# Patient Record
Sex: Female | Born: 1950
Health system: Southern US, Community
[De-identification: ages and names within clinical notes are randomized; demographics above are authoritative.]

## PROBLEM LIST (undated history)

## (undated) DIAGNOSIS — Z87440 Personal history of urinary (tract) infections: Secondary | ICD-10-CM

## (undated) DIAGNOSIS — E785 Hyperlipidemia, unspecified: Secondary | ICD-10-CM

## (undated) DIAGNOSIS — R200 Anesthesia of skin: Secondary | ICD-10-CM

## (undated) DIAGNOSIS — E669 Obesity, unspecified: Secondary | ICD-10-CM

## (undated) DIAGNOSIS — M7730 Calcaneal spur, unspecified foot: Secondary | ICD-10-CM

## (undated) DIAGNOSIS — I10 Essential (primary) hypertension: Secondary | ICD-10-CM

## (undated) DIAGNOSIS — Z87448 Personal history of other diseases of urinary system: Secondary | ICD-10-CM

## (undated) DIAGNOSIS — R51 Headache: Secondary | ICD-10-CM

## (undated) DIAGNOSIS — K5790 Diverticulosis of intestine, part unspecified, without perforation or abscess without bleeding: Secondary | ICD-10-CM

## (undated) DIAGNOSIS — M8580 Other specified disorders of bone density and structure, unspecified site: Secondary | ICD-10-CM

## (undated) HISTORY — DX: Other specified disorders of bone density and structure, unspecified site: M85.80

## (undated) HISTORY — PX: EXPLORATORY LAPAROTOMY: SUR591

## (undated) HISTORY — DX: Personal history of other diseases of urinary system: Z87.448

## (undated) HISTORY — DX: Essential (primary) hypertension: I10

## (undated) HISTORY — DX: Obesity, unspecified: E66.9

## (undated) HISTORY — PX: OTHER SURGICAL HISTORY: SHX169

## (undated) HISTORY — DX: Diverticulosis of intestine, part unspecified, without perforation or abscess without bleeding: K57.90

## (undated) HISTORY — DX: Hyperlipidemia, unspecified: E78.5

## (undated) HISTORY — DX: Anesthesia of skin: R20.0

## (undated) HISTORY — DX: Headache: R51

## (undated) HISTORY — DX: Calcaneal spur, unspecified foot: M77.30

## (undated) HISTORY — DX: Personal history of urinary (tract) infections: Z87.440

---

## 1999-08-19 ENCOUNTER — Other Ambulatory Visit: Admission: RE | Admit: 1999-08-19 | Discharge: 1999-08-19 | Payer: Self-pay | Admitting: Internal Medicine

## 2000-06-04 ENCOUNTER — Encounter: Admission: RE | Admit: 2000-06-04 | Discharge: 2000-06-04 | Payer: Self-pay | Admitting: Obstetrics and Gynecology

## 2000-06-04 ENCOUNTER — Encounter: Payer: Self-pay | Admitting: Obstetrics and Gynecology

## 2000-08-23 ENCOUNTER — Other Ambulatory Visit: Admission: RE | Admit: 2000-08-23 | Discharge: 2000-08-23 | Payer: Self-pay | Admitting: Internal Medicine

## 2001-06-06 ENCOUNTER — Encounter: Admission: RE | Admit: 2001-06-06 | Discharge: 2001-06-06 | Payer: Self-pay | Admitting: Internal Medicine

## 2001-06-06 ENCOUNTER — Encounter: Payer: Self-pay | Admitting: Internal Medicine

## 2001-08-29 ENCOUNTER — Other Ambulatory Visit: Admission: RE | Admit: 2001-08-29 | Discharge: 2001-08-29 | Payer: Self-pay | Admitting: Internal Medicine

## 2002-06-08 ENCOUNTER — Encounter: Payer: Self-pay | Admitting: Internal Medicine

## 2002-06-08 ENCOUNTER — Encounter: Admission: RE | Admit: 2002-06-08 | Discharge: 2002-06-08 | Payer: Self-pay | Admitting: Internal Medicine

## 2002-09-12 ENCOUNTER — Other Ambulatory Visit: Admission: RE | Admit: 2002-09-12 | Discharge: 2002-09-12 | Payer: Self-pay | Admitting: Internal Medicine

## 2003-06-11 ENCOUNTER — Encounter: Admission: RE | Admit: 2003-06-11 | Discharge: 2003-06-11 | Payer: Self-pay | Admitting: Internal Medicine

## 2003-06-11 ENCOUNTER — Encounter: Payer: Self-pay | Admitting: Internal Medicine

## 2003-10-25 ENCOUNTER — Other Ambulatory Visit: Admission: RE | Admit: 2003-10-25 | Discharge: 2003-10-25 | Payer: Self-pay | Admitting: Internal Medicine

## 2003-12-25 DIAGNOSIS — K5790 Diverticulosis of intestine, part unspecified, without perforation or abscess without bleeding: Secondary | ICD-10-CM

## 2003-12-25 HISTORY — PX: ESOPHAGOGASTRODUODENOSCOPY: SHX1529

## 2003-12-25 HISTORY — DX: Diverticulosis of intestine, part unspecified, without perforation or abscess without bleeding: K57.90

## 2003-12-25 HISTORY — PX: COLONOSCOPY: SHX174

## 2004-01-07 ENCOUNTER — Encounter (INDEPENDENT_AMBULATORY_CARE_PROVIDER_SITE_OTHER): Payer: Self-pay | Admitting: Specialist

## 2004-01-07 ENCOUNTER — Ambulatory Visit (HOSPITAL_COMMUNITY): Admission: RE | Admit: 2004-01-07 | Discharge: 2004-01-07 | Payer: Self-pay | Admitting: *Deleted

## 2004-03-23 ENCOUNTER — Emergency Department (HOSPITAL_COMMUNITY): Admission: EM | Admit: 2004-03-23 | Discharge: 2004-03-23 | Payer: Self-pay

## 2004-06-11 ENCOUNTER — Encounter: Admission: RE | Admit: 2004-06-11 | Discharge: 2004-06-11 | Payer: Self-pay | Admitting: Internal Medicine

## 2004-11-18 ENCOUNTER — Other Ambulatory Visit: Admission: RE | Admit: 2004-11-18 | Discharge: 2004-11-18 | Payer: Self-pay | Admitting: Internal Medicine

## 2005-06-19 ENCOUNTER — Encounter: Admission: RE | Admit: 2005-06-19 | Discharge: 2005-06-19 | Payer: Self-pay | Admitting: Internal Medicine

## 2005-07-14 ENCOUNTER — Encounter: Admission: RE | Admit: 2005-07-14 | Discharge: 2005-07-14 | Payer: Self-pay | Admitting: Internal Medicine

## 2005-08-21 ENCOUNTER — Encounter (INDEPENDENT_AMBULATORY_CARE_PROVIDER_SITE_OTHER): Payer: Self-pay | Admitting: *Deleted

## 2005-08-21 ENCOUNTER — Ambulatory Visit (HOSPITAL_COMMUNITY): Admission: RE | Admit: 2005-08-21 | Discharge: 2005-08-21 | Payer: Self-pay | Admitting: *Deleted

## 2006-05-18 ENCOUNTER — Other Ambulatory Visit: Admission: RE | Admit: 2006-05-18 | Discharge: 2006-05-18 | Payer: Self-pay | Admitting: Internal Medicine

## 2006-07-15 ENCOUNTER — Encounter: Admission: RE | Admit: 2006-07-15 | Discharge: 2006-07-15 | Payer: Self-pay | Admitting: Internal Medicine

## 2007-05-23 ENCOUNTER — Other Ambulatory Visit: Admission: RE | Admit: 2007-05-23 | Discharge: 2007-05-23 | Payer: Self-pay | Admitting: Cardiology

## 2007-07-19 ENCOUNTER — Encounter: Admission: RE | Admit: 2007-07-19 | Discharge: 2007-07-19 | Payer: Self-pay | Admitting: Internal Medicine

## 2007-08-14 ENCOUNTER — Emergency Department (HOSPITAL_COMMUNITY): Admission: EM | Admit: 2007-08-14 | Discharge: 2007-08-14 | Payer: Self-pay | Admitting: Family Medicine

## 2008-07-10 ENCOUNTER — Encounter: Admission: RE | Admit: 2008-07-10 | Discharge: 2008-07-10 | Payer: Self-pay | Admitting: Internal Medicine

## 2008-07-20 ENCOUNTER — Encounter: Admission: RE | Admit: 2008-07-20 | Discharge: 2008-07-20 | Payer: Self-pay | Admitting: Internal Medicine

## 2008-08-02 ENCOUNTER — Other Ambulatory Visit: Admission: RE | Admit: 2008-08-02 | Discharge: 2008-08-02 | Payer: Self-pay | Admitting: Interventional Radiology

## 2008-08-02 ENCOUNTER — Encounter: Admission: RE | Admit: 2008-08-02 | Discharge: 2008-08-02 | Payer: Self-pay | Admitting: Internal Medicine

## 2008-08-02 ENCOUNTER — Encounter (INDEPENDENT_AMBULATORY_CARE_PROVIDER_SITE_OTHER): Payer: Self-pay | Admitting: Interventional Radiology

## 2009-02-04 ENCOUNTER — Ambulatory Visit: Payer: Self-pay | Admitting: Vascular Surgery

## 2009-05-07 ENCOUNTER — Ambulatory Visit: Payer: Self-pay | Admitting: Vascular Surgery

## 2009-06-10 ENCOUNTER — Ambulatory Visit: Payer: Self-pay | Admitting: Vascular Surgery

## 2009-06-17 ENCOUNTER — Ambulatory Visit: Payer: Self-pay | Admitting: Vascular Surgery

## 2009-06-23 HISTORY — PX: VARICOSE VEIN SURGERY: SHX832

## 2009-07-01 ENCOUNTER — Ambulatory Visit: Payer: Self-pay | Admitting: Vascular Surgery

## 2009-07-09 ENCOUNTER — Ambulatory Visit: Payer: Self-pay | Admitting: Vascular Surgery

## 2009-07-22 ENCOUNTER — Encounter: Admission: RE | Admit: 2009-07-22 | Discharge: 2009-07-22 | Payer: Self-pay | Admitting: Internal Medicine

## 2009-08-15 ENCOUNTER — Ambulatory Visit: Payer: Self-pay | Admitting: Vascular Surgery

## 2010-03-08 IMAGING — US US SOFT TISSUE HEAD/NECK
1 series · 14 of 25 positions shown · non-contrast
Comparison: None

CLINICAL DATA: History of thyroid goiter

THYROID ULTRASOUND
TECHNIQUE: Ultrasound examination of the thyroid gland and
adjacent soft tissues was performed.

[Series 1: us soft tissue head/neck · 0.08mm/px · 14 of 56 slices shown]
[im 1/56]
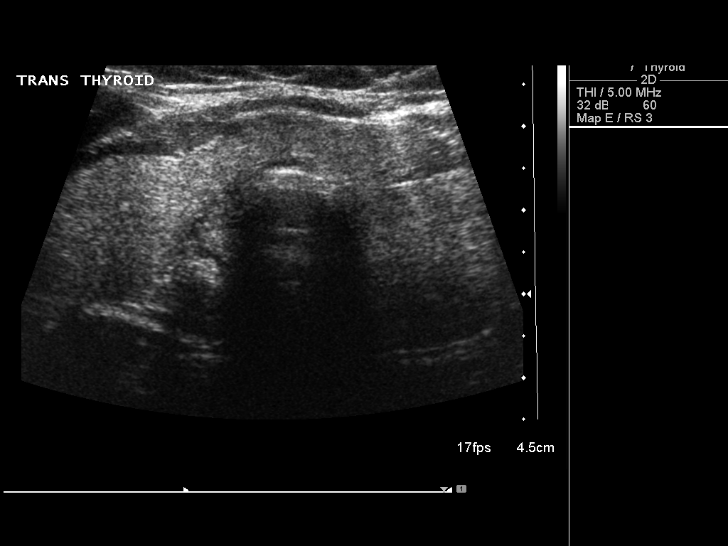
[im 5/56]
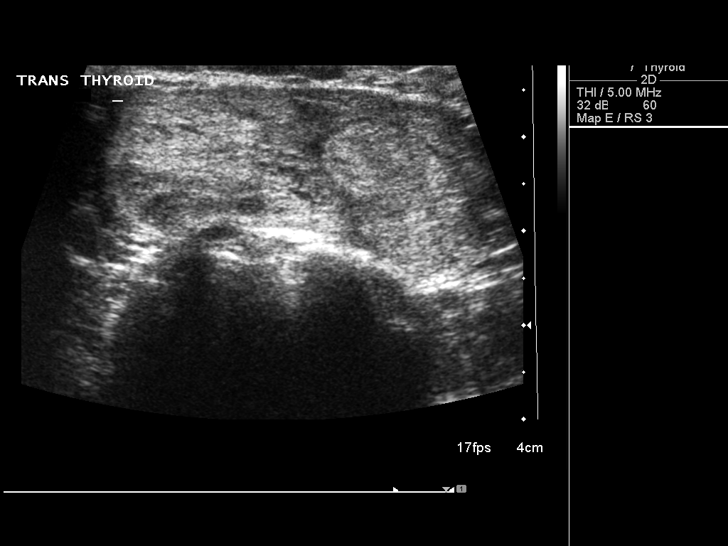
[im 10/56]
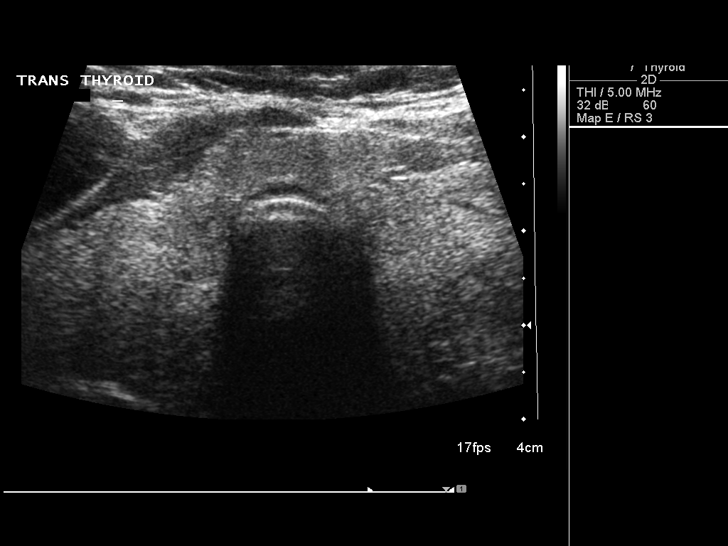
[im 14/56]
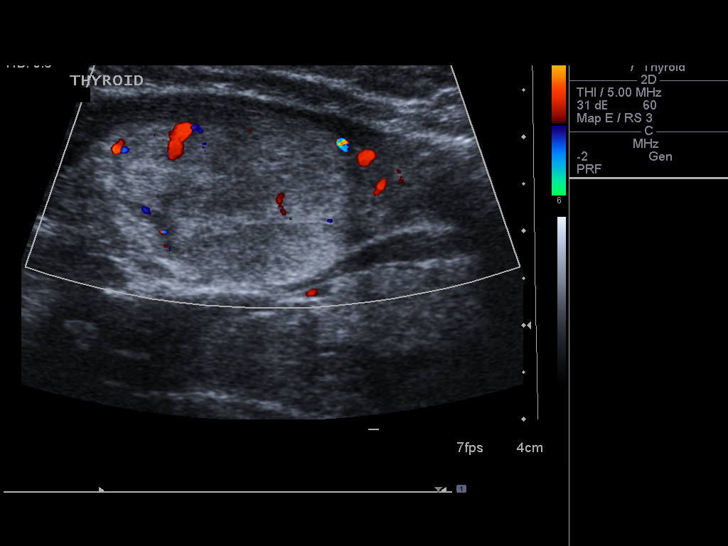
[im 19/56]
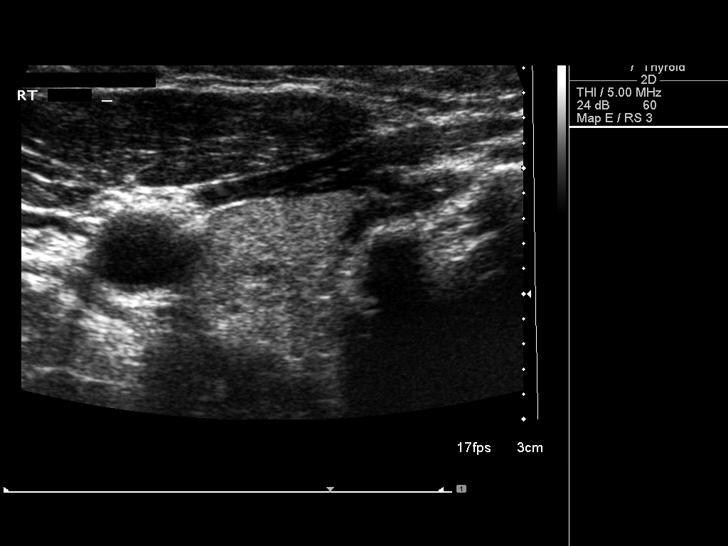
[im 21/56]
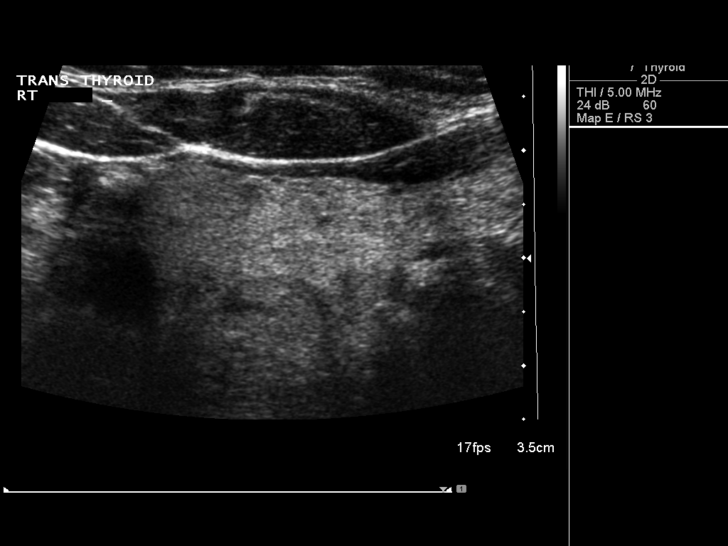
[im 26/56]
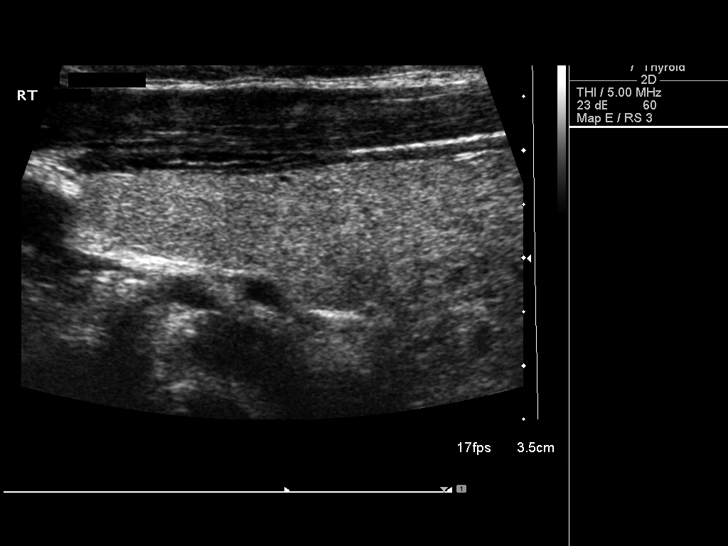
[im 30/56]
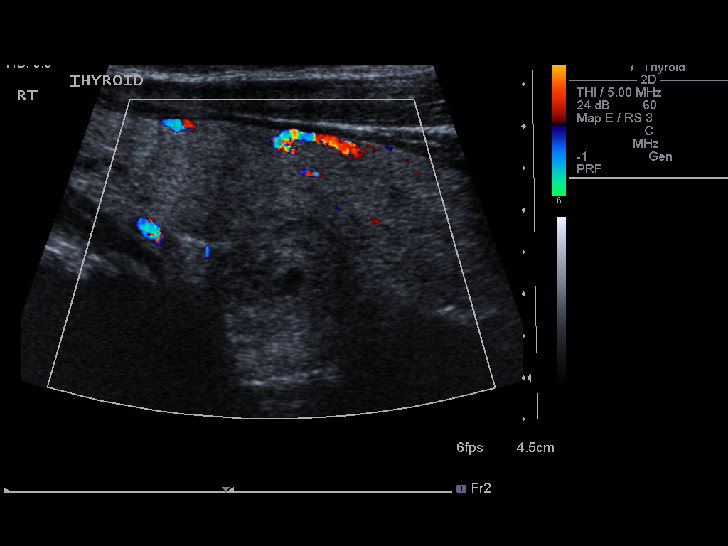
[im 35/56]
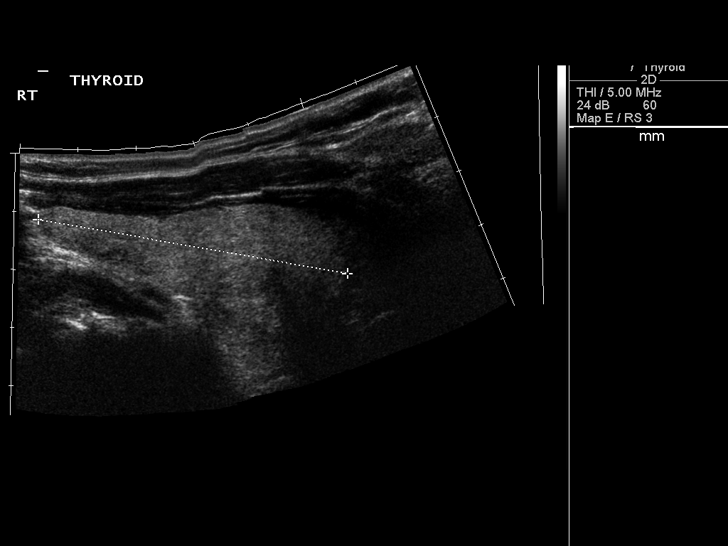
[im 37/56]
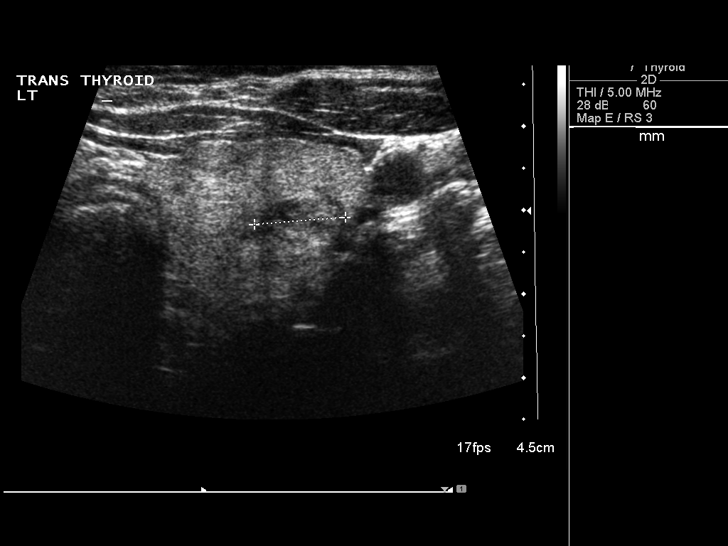
[im 42/56]
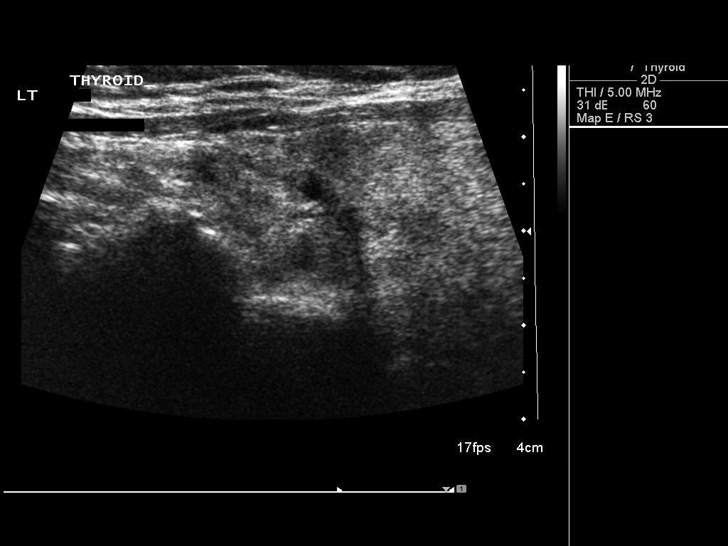
[im 46/56]
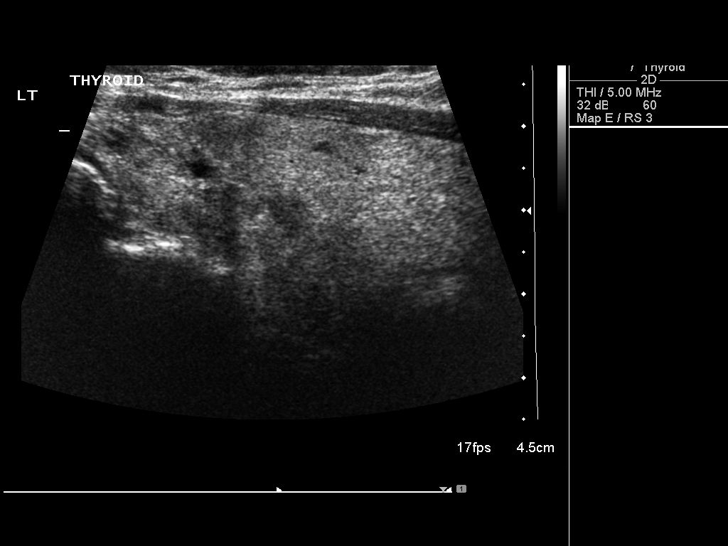
[im 51/56]
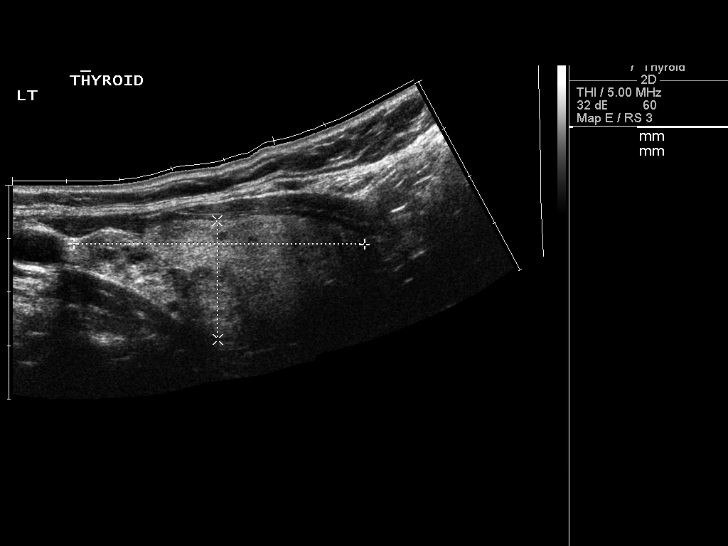
[im 56/56]
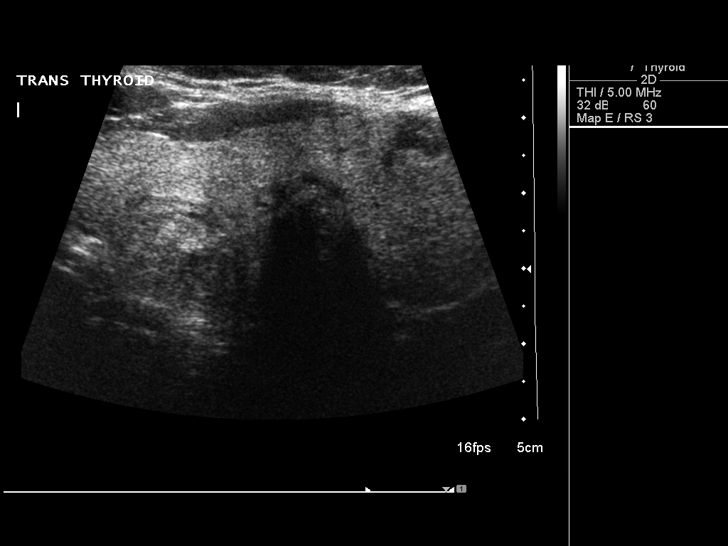

[14 of 25 positions shown; findings below may reference images not displayed]

FINDINGS: The thyroid gland is enlarged and nodular.  The right
lobe measures 5.4 cm sagittally with a depth of 3.0 cm in width of
2.5 cm.  Left lobe measures 5.4 x 2.2 x 2.6 cm, with the isthmus
measuring 1.6 cm in thickness.  There are nodules bilaterally.  The
largest nodule is solid emanating from the superior aspect of the
isthmus to the left of midline and involving the left upper lobe
medially measuring 5.3 x 1.9 x 4.4 cm.  Another nodule is noted in
the lower pole of the right lobe which is solid of 1.9 x 2.1 x
cm.  A small nodule in the mid left lobe laterally measures 1.3 x
0.9 x 1.1 cm.
IMPRESSION: Enlarged and nodular thyroid with at least three measurable nodules
.  Recommend biopsy of the dominant nodules.

## 2010-07-23 ENCOUNTER — Encounter: Admission: RE | Admit: 2010-07-23 | Discharge: 2010-07-23 | Payer: Self-pay | Admitting: Internal Medicine

## 2011-01-20 ENCOUNTER — Other Ambulatory Visit (HOSPITAL_COMMUNITY): Payer: Self-pay | Admitting: Gastroenterology

## 2011-01-20 DIAGNOSIS — K219 Gastro-esophageal reflux disease without esophagitis: Secondary | ICD-10-CM

## 2011-01-30 ENCOUNTER — Encounter (HOSPITAL_COMMUNITY)
Admission: RE | Admit: 2011-01-30 | Discharge: 2011-01-30 | Disposition: A | Discharge status: Home / Self Care | Payer: BC Managed Care – PPO | Source: Ambulatory Visit | Attending: Gastroenterology | Admitting: Gastroenterology

## 2011-01-30 ENCOUNTER — Encounter (HOSPITAL_COMMUNITY): Payer: Self-pay

## 2011-01-30 DIAGNOSIS — R6881 Early satiety: Secondary | ICD-10-CM | POA: Insufficient documentation

## 2011-01-30 DIAGNOSIS — R110 Nausea: Secondary | ICD-10-CM | POA: Insufficient documentation

## 2011-01-30 DIAGNOSIS — E119 Type 2 diabetes mellitus without complications: Secondary | ICD-10-CM | POA: Insufficient documentation

## 2011-01-30 DIAGNOSIS — K3184 Gastroparesis: Secondary | ICD-10-CM | POA: Insufficient documentation

## 2011-01-30 DIAGNOSIS — K219 Gastro-esophageal reflux disease without esophagitis: Secondary | ICD-10-CM | POA: Insufficient documentation

## 2011-01-30 MED ORDER — TECHNETIUM TC 99M SULFUR COLLOID
2.2000 | Freq: Once | INTRAVENOUS | Status: AC | PRN
  Administered 2011-01-30: 2.2 via INTRAVENOUS

## 2011-04-07 NOTE — Procedures (Signed)
LOWER EXTREMITY VENOUS REFLUX EXAM   INDICATION:  Bilateral lower extremity varicose veins.   EXAM:  Using color-flow imaging and pulse Doppler spectral analysis, the  right and left common femoral, superficial femoral, popliteal, posterior  tibial, greater and lesser saphenous veins are evaluated.  There is no  evidence suggesting deep venous insufficiency in the right and left  lower extremity.   The right and left saphenofemoral junctions are not competent.  The  right and left GSV's are not competent with the caliber as described  below.   The left proximal short saphenous vein demonstrates competency.   GSV Diameter (used if found to be incompetent only)                                            Right    Left  Proximal Greater Saphenous Vein           1.9 cm   1.7 cm  Proximal-to-mid-thigh                     1.0 cm   1.0 cm  Mid thigh                                 1.0 cm   0.9 cm  Mid-distal thigh                          1.0 cm   cm  Distal thigh                              1.0 cm   cm  Knee                                      0.8 cm   cm   IMPRESSION:  1. Right and left greater saphenous vein reflux is identified with the      caliber ranging from 1.9 cm to 0.8 cm knee to groin on the right      and from 1.7 cm to 0.9 cm in the saphenofemoral junction to the mid      thigh on the left, at which point the left greater saphenous vein      subdivides into 2 major varicose vein branches which are extremely      tortuous.  2. The right and left greater saphenous veins are not aneurysmal.  3. The left greater saphenous vein is tortuous distal to the mid      thigh; however, proximal to this point, it is a straight  vessel.  4. The deep venous system is competent.  5. The left lesser saphenous vein is not competent and originates from      Giacamini vein.      ___________________________________________  Quita Skye. Hart [place], M.D.   MC/MEDQ  D:   02/04/2009  T:  02/04/2009  Job:  [id number]

## 2011-04-07 NOTE — Assessment & Plan Note (Signed)
OFFICE VISIT   Svec, Latrisha L  DOB:  01-22-1951                                       06/10/2009  [id number]#:[id number]   This is an addendum to the operative procedure note in the office on  July 19th.  Patient underwent laser ablation of her right great  saphenous vein with greater than 20 stab phlebectomies.  The laser  ablation portion required 2 separate laser procedures to completely  close the right great saphenous vein.  Initial entrance into the vein  was just below the knee level, where the vein was large with gross  reflux.  The wire would only burst to the junction of the mid-to-distal  thigh, but because of tortuosity would go no more proximally.  The  second puncture site in the mid thigh was then performed with a second  laser procedure being formed up to the saphenofemoral junction.  The  proximal ablation utilized 1110 joules, and the distal ablation utilized  720 joules using separate sheaths and separate laser fibers.  Following  this, the greater than 20 stab phlebectomies in the distal thigh and  calf were performed.  The patient tolerated this procedure well.  Return  in 1 week for followup.   [name], M.D.  Electronically Signed   JDL/MEDQ  D:  06/10/2009  T:  06/11/2009  Job:  [id number]

## 2011-04-07 NOTE — Procedures (Signed)
DUPLEX DEEP VENOUS EXAM - LOWER EXTREMITY   INDICATION:  Followup evaluation post EVLT.   HISTORY:  Edema:  Right thigh edema.  Trauma/Surgery:  Right greater saphenous vein EVLT on June 10, 2009.  Pain:  Right distal calf pain at the medial aspect of the leg.  PE:  No  Previous DVT:  No  Anticoagulants:  No  Other:  No   DUPLEX EXAM:                CFV   SFV   PopV  PTV    GSV                R  L  R  L  R  L  R   L  R  L  Thrombosis    o     o     o     o      +  Spontaneous   +     +     +     +      o  Phasic        +     +     +     +      o  Augmentation  +     +     +     +      o  Compressible  +     +     +     +      o  Competent     +     +     +     +      o   Legend:  + - yes  o - no  p - partial  D - decreased   IMPRESSION:  1. No evidence of right leg deep vein thrombus or Baker cyst.  2. The right greater saphenous vein is thrombosed from the      saphenofemoral junction through to the distal calf.  3. The right greater saphenous vein anterolateral branch is patent and      competent.  4. There appear to be a number of varicose veins at the distal medial      ankle, which are partially thrombosed.    _____________________________  [name], M.D.   MC/MEDQ  D:  06/17/2009  T:  06/18/2009  Job:  [id number]

## 2011-04-07 NOTE — Assessment & Plan Note (Signed)
OFFICE VISIT   Dufault, Theola L  DOB:  [birth year]                                       06/17/2009  UVOZD#:[id number]   HISTORY OF PRESENT ILLNESS:  The patient returns 1 week post laser  ablation of her right great saphenous vein with 2 laser procedures, one  the distal great saphenous vein and one in the more proximal great  saphenous vein as well as greater than 20 stab phlebectomies for painful  varicosities.  She had an excellent early result with total occlusion of  the right great saphenous vein, no evidence of deep venous obstruction.  There is an antral lateral branch of the great saphenous vein which is  patent but it is competent.  She has had very little discomfort  associated with the laser ablation procedure or the stab phlebectomies  but does have occasional burning discomfort in the lower third of the  leg toward the ankle and no distal edema.  Her biggest complaint is  related to the elastic compression stockings irritating her proximal  thigh.  She is reassured regarding these findings and we will schedule  her in the near future for laser ablation of the left great saphenous  vein with multiple stab phlebectomies to be followed by 2 courses of  sclerotherapy on the left side.   [name], M.D.  Electronically Signed   JDL/MEDQ  D:  06/17/2009  T:  06/18/2009  Job:  [id number]

## 2011-04-07 NOTE — Assessment & Plan Note (Signed)
OFFICE VISIT   Wachsmuth, Margueritte L  DOB:  04/20/1951                                       05/07/2009  [id number]#:[id number]   The patient returns today for further evaluation of severe venous  insufficiency both lower extremities with severe painful varicosities.  The right leg is continuing to be quite painful and hypersensitive  having worsened in the last 3 months since wearing her long-leg elastic  compression stockings.  She has a few areas of skin that she is  concerned about which are almost preulcerative in nature where the  hyperpigmentation and thickening is severe and she has severe  hyperpigmentation, aching, throbbing and burning in the right leg below  the knee.  Left leg also has aching and throbbing in the thigh and calf  both anteriorly and posteriorly secondary to painful bulging  varicosities.  The elastic compression stockings, elevation and  ibuprofen have not touched her symptoms which seem to be getting worse.  She does have severe reflux in both great saphenous veins and the left  small saphenous vein feeding these varicosities.   Blood pressure today is 126/84, heart rate is 56.   She continues to have severe symptomatology and I think the following  procedures should be performed on this lady:  1. Laser ablation of the right great saphenous vein with multiple stab      phlebectomies.  2. Laser ablation of the left great saphenous vein with multiple stab      phlebectomies.  3. Two additional courses of sclerotherapy in the left leg for the      painful varicosities.  4. Laser ablation of the left small saphenous vein with multiple stab      phlebectomies.   We will try to get these scheduled as soon as possible because her  symptoms are worsening and her skin looks very vulnerable to ulceration  particularly in the right ankle.   [name], M.D.  Electronically Signed   JDL/MEDQ  D:  05/07/2009  T:  05/08/2009  Job:  [id number]

## 2011-04-07 NOTE — Consult Note (Signed)
VASCULAR SURGERY CONSULTATION   Dunn, Paula L  DOB:  Jul 02, 1951                                       02/04/2009  [id number]#:[id number]   The patient is 60 year old female referred by Dr. Selena Batten for severe venous  insufficiency about the lower extremities.  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  She is also noticed over the last 9-12  months the skin in her lower third of her leg and medial ankle areas  have become dark, thick and irregular.  She has mild swelling as the day  progresses.  She may have had an episode of superficial thrombophlebitis  recently in the right leg, although this was not confirmed by venous  duplex, but she has no history of deep venous thrombosis.  She describes  an aching, itching discomfort in both lower extremities both in the  thighs and calf areas, posteriorly and anteriorly, which worsens as the  day progresses.  She is unable to elevate her legs during the day and  does not wear elastic compression stockings nor take pain medication.   PAST MEDICAL HISTORY:  1. Non-insulin diabetes mellitus.  2. Hypertension.  3. Hyperlipidemia.  4. Negative for coronary artery disease, COPD or stroke.   PAST SURGICAL HISTORY:  Bartholin cyst.   FAMILY HISTORY:  Positive for congestive heart failure in her father.  Negative for diabetes or stroke.   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  She has not used tobacco  since 2000.  Does not use alcohol.   REVIEW OF SYSTEMS:  Positive for occasional dyspnea on exertion and some  occasional abdominal discomfort.  Also has diffuse joint pain,  arthritis.   ALLERGIES:  None known.   MEDICATIONS:  Please see health history form.   PHYSICAL EXAMINATION:  Blood  pressure 130/80, heart rate 70,  respirations 14.  General:  She is a middle-aged female in no apparent  distress, alert and oriented x3.  Neck:  Supple, 3+ carotid pulses  palpable.  No bruits are audible.  Neurologic:  Normal.  No palpable  adenopathy in the neck.  Chest:  Clear to auscultation.  Cardiovascular:  Regular rhythm, no murmurs.  Abdomen:  Soft, nontender with no masses.  She has 3+ femoral, popliteal and dorsalis pedis pulses bilaterally.  Both legs have severe venous insufficiency with large bulbous  varicosities and hyperpigmentation in a pre-ulcerative area in the right  lower third of the leg medially near the medial malleolus.  There is 1+  distal edema bilaterally.  The largest varicosities on the right leg are  medially over the great saphenous vein beginning at the knee, extending  to the ankle.  On the left leg there is a large semicircular trunk of  varicosities beginning in the mid thigh, extending laterally around the  knee into the pretibial region as well as in both calf regions  posteriorly.   Venous duplex exam reveals severe reflux in the right great saphenous  vein from the saphenofemoral junction to the knee.  On the left side  there is severe reflux at the left saphenofemoral junction down  to the  mid thigh where the large strand of varicosities communicates.  The left  small saphenous also has gross reflux and communicates to the great  saphenous via the branch of Giacomini.   This patient has severe venous insufficiency of both legs with  hyperpigmentation, ulceration and dermatosclerosis.  We will treat her  with long-leg elastic compression stockings (20-mm to 30-mm) as well as  elevation, as much as her job will allow, and analgesics (ibuprofen).  She will return in 3 months.  After that time, unless she has had  dramatic improvement, I think she would be best treated with three  separate procedures.  This will require laser ablation of both  great  saphenous veins with multiple stab phlebectomies as well as a laser  ablation of the left small saphenous vein.  She will return in 3 months.   [name], M.D.  Electronically Signed  JDL/MEDQ  D:  02/04/2009  T:  02/05/2009  Job:  [id number]

## 2011-04-07 NOTE — Procedures (Signed)
DUPLEX DEEP VENOUS EXAM - LOWER EXTREMITY   INDICATION:  Follow-up left greater saphenous vein ablation.   HISTORY:  Edema:  Left lower extremity.  Trauma/Surgery:  Right greater saphenous vein ablation 06/10/2009, left  greater saphenous vein ablation 07/01/2009.  Pain:  Right calf.  PE:  No.  Previous DVT:  No.  Anticoagulants:  Small aspirin.  Other:   DUPLEX EXAM:                CFV   SFV   PopV   PTV   GSV                R  L  R  L  R  L   R  L  R  L  Thrombosis    0  0     0     0      0     +  Spontaneous   +  +     +     +      +     0  Phasic        +  +     +     +      +     0  Augmentation  +  +     +     +      +     0  Compressible  +  +     +     +      +     0  Competent     D  D     +     +      +     0   Legend:  + - yes  o - no  p - partial  D - decreased   IMPRESSION:  1. No evidence of DVT in the left lower extremity or right common      femoral vein.  2. Evidence of ablated left greater saphenous in thigh (tortuous) with      thrombosed varicosities in the thigh and the calf.  3. Patent competent left greater saphenous vein medial branch noted.    _____________________________  [name], M.D.   AS/MEDQ  D:  07/09/2009  T:  07/09/2009  Job:  [id number]

## 2011-04-07 NOTE — Assessment & Plan Note (Signed)
OFFICE VISIT   Paula Dunn, Paula Dunn  DOB:  10-Aug-1951                                       07/09/2009  [id number]#:[id number]   Patient underwent laser ablation of the left great saphenous vein and  greater than 20 stab phlebectomies in the left thigh and calf 1 week ago  for painful varicosities.  She returns today.  She had some mild  discomfort in the thigh at the ablation site, which is resolving, but  has had no pain related to the stab phlebectomies.  She has had no  distal edema.   On exam today, there is mild tenderness at the ablation site, as one  would expect, and nice healing of the stab phlebectomy wounds.  No  distal edema is noted.   Venous duplex revealed no evidence of DVT with a closure of the left  great saphenous vein and some thrombosed varicosities in the left thigh  and calf.  There is a __________ medial branch of the left great  saphenous vein noted.   In general, I think she is getting along well and will schedule her for  courses of sclerotherapy in the left leg in the coming weeks.   [name], M.D.  Electronically Signed   JDL/MEDQ  D:  07/09/2009  T:  07/09/2009  Job:  [id number]

## 2011-04-10 NOTE — Op Note (Signed)
NAME:  Paula Dunn, Paula Dunn                ACCOUNT NO.:  [id number]   MEDICAL RECORD NO.:  [id number]          PATIENT TYPE:  AMB   LOCATION:  SDC                           FACILITY:  [facility]   PHYSICIAN:  Lowman B. Earlene Plater, M.D.  DATE OF BIRTH:  02/05/1951   DATE OF PROCEDURE:  08/21/2005  DATE OF DISCHARGE:                                 OPERATIVE REPORT   PREOPERATIVE DIAGNOSIS:  Postmenopausal bleeding.   POSTOPERATIVE DIAGNOSIS:  Postmenopausal bleeding.   PROCEDURE:  Hysteroscopy, dilatation and curettage.   SURGEON:  Chester Holstein. Earlene Plater, M.D.   ASSISTANT:  None.   ANESTHESIA:  LMA general, 10 mL 1% Nesacaine paracervical block.   SPECIMENS:  Endometrial curettings.   ESTIMATED BLOOD LOSS:  Minimal.   COMPLICATIONS:  None.   FLUID DEFICIT:  100 mL sorbitol.   INDICATIONS:  Patient with a history of postmenopausal bleeding.  Ultrasound  showed a thickened endometrium and sonohysterogram suggestive of endometrial  polyp.  Patient advised of the risks of surgery including infection,  bleeding, uterine perforation, damage to surrounding organs.   PROCEDURE:  The patient was taken to the operating room and a general  anesthesia obtained.  She was placed in the [place] stirrups, prepped and  draped in the usual sterile fashion, bladder emptied by in-and-out  catheterization.  Exam under anesthesia:  An anteverted, normal-sized  uterus, no adnexal masses.   Speculum inserted, paracervical block placed.  A single-tooth attached to  the anterior lip of the cervix and the cervix easily dilated to #31.   The resectoscope was inserted after being flushed with sorbitol.  Good  uterine distention was obtained.  The endometrial cavity was inspected in  its entirety and no abnormalities were seen.  The pattern appeared most  consistent with atrophy.  The endometrium was gently curetted and the  procedure terminated.   Instruments were removed and cervix hemostatic.  The patient tolerated the  procedure well and no complications.  She was taken to the recovery room  awake, in stable condition.      Gerri Spore B. Earlene Plater, M.D.  Electronically Signed     WBD/MEDQ  D:  08/21/2005  T:  08/21/2005  Job:  [id number]

## 2011-04-10 NOTE — Op Note (Signed)
NAME:  Paula Dunn, Paula Dunn                          ACCOUNT NO.:  [id number]   MEDICAL RECORD NO.:  [id number]                   PATIENT TYPE:  AMB   LOCATION:  ENDO                                 FACILITY:  [facility]   PHYSICIAN:  Georgiana Spinner, M.D.                 DATE OF BIRTH:  1951-05-20   DATE OF PROCEDURE:  01/07/2004  DATE OF DISCHARGE:                                 OPERATIVE REPORT   PROCEDURE:  Colonoscopy.   INDICATIONS:  Colon polyps.   ANESTHESIA:  Versed 5 mg.   DESCRIPTION OF PROCEDURE:  With patient mildly sedated in the left lateral  decubitus position, the Olympus videoscopic colonoscope was inserted in the  rectum, passed under direct vision to the cecum, identified by the ileocecal  valve and appendiceal orifice.  There were diverticula seen in the right  colon.  From this point, the colonoscope was slowly withdrawn, taking  circumferential views of the colonic mucosa, stopping only at approximately  10 cm from the anal verge at which point a polyp was seen, photographed, and  removed using hot biopsy forceps technique, setting of 20-20 blended current  and in retroflexed view, another polyp was seen, photographed, and it too  was removed using snare cautery technique, again at a setting of 20-20  blended current.  Both were retrieved for pathology, the first one at 10 cm  from the anal verge, the second labeled distal rectum.  The endoscope was  straightened, withdrawn.  The patient's vital signs, pulse oximeter remained  stable.  The patient tolerated the procedure well without apparent  complications.   FINDINGS:  1. Polyps as described above.  2. Diverticulosis of the right colon.  3. Otherwise, unremarkable examination.   PLAN:  The patient will call me for results of biopsy and follow up with me  as an outpatient.                                               Georgiana Spinner, M.D.    GMO/MEDQ  D:  01/07/2004  T:  01/07/2004  Job:  [id number]

## 2011-04-10 NOTE — Op Note (Signed)
NAME:  Paula Dunn, Paula Dunn                          ACCOUNT NO.:  [id number]   MEDICAL RECORD NO.:  [id number]                   PATIENT TYPE:  AMB   LOCATION:  ENDO                                 [facility]ACILITY[facility]   PHYSICIAN:  Georgiana Spinner, M.D.                 DATE OF BIRTH:  August 31, 1951   DATE OF PROCEDURE:  01/07/2004  DATE OF DISCHARGE:                                 OPERATIVE REPORT   PROCEDURE:  Upper endoscopy.   INDICATIONS:  GERD.   ANESTHESIA:  Demerol 50 mg, Versed 4 mg.   DESCRIPTION OF PROCEDURE:  With the patient mildly sedated in the left  lateral decubitus position, the Olympus videoscopic endoscope was inserted  in the mouth, passed under direct vision through the esophagus, which  appeared normal, no evidence of Barrett's.  We entered into the stomach.  Fundus, body, antrum appeared normal.  Duodenal bulb showed minimal erythema  in one small area.  The second portion of the duodenum appeared normal.  From this point the endoscope was slowly withdrawn taking circumferential  views of the duodenal mucosa until the endoscope was pulled back into the  stomach, placed in retroflexion to view the stomach from below.  The  endoscope was straightened and withdrawn, taking circumferential views of  the remaining gastric and esophageal mucosa.  The patient's vital signs and  pulse oximetry remained stable.  The patient tolerated the procedure well  without apparent complications.   FINDINGS:  Minimal erythema of duodenal bulb, otherwise unremarkable exam.   PLAN:  Proceed to colonoscopy.                                               Georgiana Spinner, M.D.    GMO/MEDQ  D:  01/07/2004  T:  01/07/2004  Job:  [id number]

## 2011-06-19 ENCOUNTER — Other Ambulatory Visit: Payer: Self-pay | Admitting: Internal Medicine

## 2011-06-19 DIAGNOSIS — Z1231 Encounter for screening mammogram for malignant neoplasm of breast: Secondary | ICD-10-CM

## 2011-07-28 ENCOUNTER — Ambulatory Visit: Payer: BC Managed Care – PPO

## 2011-07-29 ENCOUNTER — Ambulatory Visit: Payer: BC Managed Care – PPO

## 2011-09-02 ENCOUNTER — Ambulatory Visit
Admission: RE | Admit: 2011-09-02 | Discharge: 2011-09-02 | Disposition: A | Discharge status: Home / Self Care | Payer: BC Managed Care – PPO | Source: Ambulatory Visit | Attending: Internal Medicine | Admitting: Internal Medicine

## 2011-09-02 DIAGNOSIS — Z1231 Encounter for screening mammogram for malignant neoplasm of breast: Secondary | ICD-10-CM

## 2012-08-18 ENCOUNTER — Other Ambulatory Visit: Payer: Self-pay | Admitting: Internal Medicine

## 2012-08-18 DIAGNOSIS — Z1231 Encounter for screening mammogram for malignant neoplasm of breast: Secondary | ICD-10-CM

## 2012-09-08 ENCOUNTER — Ambulatory Visit
Admission: RE | Admit: 2012-09-08 | Discharge: 2012-09-08 | Disposition: A | Discharge status: Home / Self Care | Payer: BC Managed Care – PPO | Source: Ambulatory Visit | Attending: Internal Medicine | Admitting: Internal Medicine

## 2012-09-08 DIAGNOSIS — Z1231 Encounter for screening mammogram for malignant neoplasm of breast: Secondary | ICD-10-CM

## 2012-09-13 ENCOUNTER — Ambulatory Visit: Payer: BC Managed Care – PPO

## 2013-08-11 ENCOUNTER — Other Ambulatory Visit: Payer: Self-pay

## 2013-08-11 DIAGNOSIS — Z1231 Encounter for screening mammogram for malignant neoplasm of breast: Secondary | ICD-10-CM

## 2013-09-26 ENCOUNTER — Ambulatory Visit
Admission: RE | Admit: 2013-09-26 | Discharge: 2013-09-26 | Disposition: A | Discharge status: Home / Self Care | Payer: BC Managed Care – PPO | Source: Ambulatory Visit

## 2013-09-26 DIAGNOSIS — Z1231 Encounter for screening mammogram for malignant neoplasm of breast: Secondary | ICD-10-CM

## 2013-12-13 ENCOUNTER — Other Ambulatory Visit: Payer: Self-pay | Admitting: Internal Medicine

## 2013-12-13 DIAGNOSIS — R519 Headache, unspecified: Secondary | ICD-10-CM

## 2013-12-13 DIAGNOSIS — R51 Headache: Principal | ICD-10-CM

## 2013-12-18 ENCOUNTER — Other Ambulatory Visit: Payer: BC Managed Care – PPO

## 2013-12-18 ENCOUNTER — Other Ambulatory Visit: Payer: Self-pay | Admitting: Internal Medicine

## 2013-12-18 ENCOUNTER — Ambulatory Visit
Admission: RE | Admit: 2013-12-18 | Discharge: 2013-12-18 | Disposition: A | Discharge status: Home / Self Care | Payer: BC Managed Care – PPO | Source: Ambulatory Visit | Attending: Internal Medicine | Admitting: Internal Medicine

## 2013-12-18 DIAGNOSIS — R519 Headache, unspecified: Secondary | ICD-10-CM

## 2013-12-18 DIAGNOSIS — R51 Headache: Principal | ICD-10-CM

## 2014-01-01 ENCOUNTER — Encounter: Payer: Self-pay | Admitting: Neurology

## 2014-01-02 ENCOUNTER — Telehealth: Payer: Self-pay | Admitting: *Deleted

## 2014-01-02 ENCOUNTER — Encounter: Payer: Self-pay | Admitting: Neurology

## 2014-01-02 ENCOUNTER — Ambulatory Visit (INDEPENDENT_AMBULATORY_CARE_PROVIDER_SITE_OTHER): Payer: BC Managed Care – PPO | Admitting: Neurology

## 2014-01-02 VITALS — BP 147/87 | HR 76 | Ht 66.0 in | Wt 229.0 lb

## 2014-01-02 DIAGNOSIS — R519 Headache, unspecified: Secondary | ICD-10-CM | POA: Insufficient documentation

## 2014-01-02 DIAGNOSIS — R51 Headache: Secondary | ICD-10-CM

## 2014-01-02 HISTORY — DX: Headache: R51

## 2014-01-02 MED ORDER — PREDNISONE 5 MG PO TABS: ORAL_TABLET | ORAL | Status: DC

## 2014-01-02 NOTE — Patient Instructions (Signed)
Migraine Headache A migraine headache is an intense, throbbing pain on one or both sides of your head. A migraine can last for 30 minutes to several hours. CAUSES  The exact cause of a migraine headache is not always known. However, a migraine may be caused when nerves in the brain become irritated and release chemicals that cause inflammation. This causes pain. Certain things may also trigger migraines, such as:  Alcohol.  Smoking.  Stress.  Menstruation.  Aged cheeses.  Foods or drinks that contain nitrates, glutamate, aspartame, or tyramine.  Lack of sleep.  Chocolate.  Caffeine.  Hunger.  Physical exertion.  Fatigue.  Medicines used to treat chest pain (nitroglycerine), birth control pills, estrogen, and some blood pressure medicines. SIGNS AND SYMPTOMS  Pain on one or both sides of your head.  Pulsating or throbbing pain.  Severe pain that prevents daily activities.  Pain that is aggravated by any physical activity.  Nausea, vomiting, or both.  Dizziness.  Pain with exposure to bright lights, loud noises, or activity.  General sensitivity to bright lights, loud noises, or smells. Before you get a migraine, you may get warning signs that a migraine is coming (aura). An aura may include:  Seeing flashing lights.  Seeing bright spots, halos, or zig-zag lines.  Having tunnel vision or blurred vision.  Having feelings of numbness or tingling.  Having trouble talking.  Having muscle weakness. DIAGNOSIS  A migraine headache is often diagnosed based on:  Symptoms.  Physical exam.  A CT scan or MRI of your head. These imaging tests cannot diagnose migraines, but they can help rule out other causes of headaches. TREATMENT Medicines may be given for pain and nausea. Medicines can also be given to help prevent recurrent migraines.  HOME CARE INSTRUCTIONS  Only take over-the-counter or prescription medicines for pain or discomfort as directed by your  health care provider. The use of long-term narcotics is not recommended.  Lie down in a dark, quiet room when you have a migraine.  Keep a journal to find out what may trigger your migraine headaches. For example, write down:  What you eat and drink.  How much sleep you get.  Any change to your diet or medicines.  Limit alcohol consumption.  Quit smoking if you smoke.  Get 7 9 hours of sleep, or as recommended by your health care provider.  Limit stress.  Keep lights dim if bright lights bother you and make your migraines worse. SEEK IMMEDIATE MEDICAL CARE IF:   Your migraine becomes severe.  You have a fever.  You have a stiff neck.  You have vision loss.  You have muscular weakness or loss of muscle control.  You start losing your balance or have trouble walking.  You feel faint or pass out.  You have severe symptoms that are different from your first symptoms. MAKE SURE YOU:   Understand these instructions.  Will watch your condition.  Will get help right away if you are not doing well or get worse. Document Released: 11/09/2005 Document Revised: 08/30/2013 Document Reviewed: 07/17/2013 [employer] Patient Information 2014 [employer].  

## 2014-01-02 NOTE — Progress Notes (Signed)
Reason for visit: Headache  Paula Dunn is an 63 y.o. female  History of present illness:  Paula Dunn is a 63 year old right-handed black female with a history of onset headache that began in 6 weeks prior to this evaluation. The patient indicates that she has never really had headaches during her lifetime, but 6 weeks ago, the patient noted relatively sudden onset of headache when she tried to sit up after taking a nap. The patient has had headaches throughout the head, without associated neck stiffness. The patient denies any low grade fevers or chills. The patient did develop photophobia and phonophobia with the headache, and she may have occasional episodes of flashing lights in her vision. The patient has had some persistence of the headache, but the headache has waxed and waned in severity over the last 6 weeks. The patient has some numbness of the hands, she may have some occasional nausea with the headache. The patient denies any focal weakness of the arms or legs, and she denies problems controlling the bowels or the bladder. With ambulation, the patient may have a sensation of veering to the right. The patient denies any falls. The patient does have some sinus drainage at times. The patient underwent a CT scan of the brain that was unremarkable. These images were reviewed on line. The patient is sent to this office for further evaluation. The patient been placed on amitriptyline, currently taking 12.5 mg at night. The patient is tolerating this medication fairly well.  Past Medical History  Diagnosis Date  . Diabetes mellitus   . Diverticulosis 12/2003  . Obesity   . Hyperlipidemia   . Hypertension   . Osteopenia   . History of UTI   . History of hematuria   . Heel spur   . OZHYQMVH(846.9Headache(784.0) 01/02/2014    Past Surgical History  Procedure Laterality Date  . Hammertoe    . Colonoscopy  12/2003  . Exploratory laparotomy      DR. DAVIS    BENIGN  . Esophagogastroduodenoscopy   12/2003  . Thyroid needle biopsy    . Varicose vein surgery  06/2009    Family History  Problem Relation Age of Onset  . Migraines Neg Hx     Social history:  reports that she has quit smoking. She has never used smokeless tobacco. She reports that she drinks alcohol. She reports that she does not use illicit drugs.   No Known Allergies  Medications:  Current Outpatient Prescriptions on File Prior to Visit  Medication Sig Dispense Refill  . amitriptyline (ELAVIL) 25 MG tablet Take 25 mg by mouth at bedtime.       Marland Kitchen. aspirin 81 MG tablet Take 81 mg by mouth daily.      . bisoprolol (ZEBETA) 5 MG tablet Take 5 mg by mouth daily.      . Cholecalciferol (VITAMIN D) 2000 UNITS CAPS Take 1 capsule by mouth daily.      . fluticasone (FLONASE) 50 MCG/ACT nasal spray Place 2 sprays into both nostrils daily.      Marland Kitchen. loratadine (CLARITIN) 10 MG tablet Take 10 mg by mouth daily as needed for allergies.      Marland Kitchen. LORazepam (ATIVAN) 0.5 MG tablet Take 0.5 mg by mouth every 8 (eight) hours as needed for anxiety (FOR ANXIETY).      . metFORMIN (GLUCOPHAGE) 500 MG tablet Take 500 mg by mouth 2 (two) times daily with a meal.      . pravastatin (PRAVACHOL)  40 MG tablet Take 40 mg by mouth daily.       No current facility-administered medications on file prior to visit.    ROS:  Out of a complete 14 system review of symptoms, the patient complains only of the following symptoms, and all other reviewed systems are negative.  Ringing in the ears, dizziness Rash, itching Blurred vision, eye pain, shortness of breath, snoring Feeling cold Runny nose, skin sensitivity Headache, dizziness Anxiety, decreased energy, change in appetite, disinterest in activities Sleepiness  Blood pressure 147/87, pulse 76, height 5\' 6"  (1.676 m), weight 229 lb (103.874 kg).  Physical Exam  General: The patient is alert and cooperative at the time of the examination. The patient is moderately obese.  Eyes: Pupils are  equal, round, and reactive to light. Discs are flat bilaterally.  Neck: The neck is supple, no carotid bruits are noted.  Respiratory: The respiratory examination is clear.  Cardiovascular: The cardiovascular examination reveals a regular rate and rhythm, no obvious murmurs or rubs are noted.  Neuromuscular: Range of movement of cervical spine is full. The patient has no crepitus in the temporomandibular joints.  Skin: Extremities are without significant edema.  Neurologic Exam  Mental status: The patient is alert and oriented x 3 at the time of the examination. The patient has apparent normal recent and remote memory, with an apparently normal attention span and concentration ability.  Cranial nerves: Facial symmetry is present. There is good sensation of the face to pinprick and soft touch bilaterally. The strength of the facial muscles and the muscles to head turning and shoulder shrug are normal bilaterally. Speech is well enunciated, no aphasia or dysarthria is noted. Extraocular movements are full. Visual fields are full. The tongue is midline, and the patient has symmetric elevation of the soft palate. No obvious hearing deficits are noted.  Motor: The motor testing reveals 5 over 5 strength of all 4 extremities. Good symmetric motor tone is noted throughout.  Sensory: Sensory testing is intact to pinprick, soft touch, vibration sensation, and position sense on all 4 extremities. No evidence of extinction is noted.  Coordination: Cerebellar testing reveals good finger-nose-finger and heel-to-shin bilaterally.  Gait and station: Gait is normal. Tandem gait is normal. Romberg is negative. No drift is seen.  Reflexes: Deep tendon reflexes are symmetric, but are depressed bilaterally. Toes are downgoing bilaterally.   CT brain 12/18/2013:  IMPRESSION:  1. Negative for bleed or other acute intracranial process.      Assessment/Plan:  1. Headache, possible migraine  The  patient has no prior history of migraine, and she has had onset of frequent headaches over the last 6 weeks. Given this history, the patient will be sent for a sedimentation rate. The patient will be considered for MRI evaluation of the brain if the headaches persist. The patient will go up on amitriptyline taking 25 mg at night, and she will be given a prednisone Dosepak to try to eliminate the headache rapidly. The patient indicates that her diabetes is under excellent control. The patient will followup in 3 months.  Paula Palau MD 01/02/2014 7:42 PM  [facility] [address] [place], [place] [address]  Phone [phone] Fax [phone]

## 2014-01-04 ENCOUNTER — Other Ambulatory Visit (INDEPENDENT_AMBULATORY_CARE_PROVIDER_SITE_OTHER): Payer: Self-pay

## 2014-01-04 ENCOUNTER — Ambulatory Visit: Payer: BC Managed Care – PPO | Admitting: Nurse Practitioner

## 2014-01-04 DIAGNOSIS — Z0289 Encounter for other administrative examinations: Secondary | ICD-10-CM

## 2014-01-04 LAB — SEDIMENTATION RATE: Sed Rate: 22 mm/hr (ref 0–40)

## 2014-01-05 NOTE — Telephone Encounter (Signed)
Called patient to inform her that her blood work results were normal and if she has any other problems, questions or concerns to call the office. Patient verbalized understanding.

## 2014-01-05 NOTE — Telephone Encounter (Signed)
Patient states she received a missed call today (01/05/14) but no one left a message. She was here for labwork yesterday. Please return call to patient.

## 2014-03-08 ENCOUNTER — Encounter: Payer: Self-pay | Admitting: Nurse Practitioner

## 2014-03-26 DIAGNOSIS — H524 Presbyopia: Secondary | ICD-10-CM | POA: Diagnosis not present

## 2014-03-26 DIAGNOSIS — H40029 Open angle with borderline findings, high risk, unspecified eye: Secondary | ICD-10-CM | POA: Diagnosis not present

## 2014-03-28 DIAGNOSIS — E119 Type 2 diabetes mellitus without complications: Secondary | ICD-10-CM | POA: Diagnosis not present

## 2014-03-28 DIAGNOSIS — I10 Essential (primary) hypertension: Secondary | ICD-10-CM | POA: Diagnosis not present

## 2014-04-02 DIAGNOSIS — E119 Type 2 diabetes mellitus without complications: Secondary | ICD-10-CM | POA: Diagnosis not present

## 2014-04-02 DIAGNOSIS — F411 Generalized anxiety disorder: Secondary | ICD-10-CM | POA: Diagnosis not present

## 2014-04-02 DIAGNOSIS — I10 Essential (primary) hypertension: Secondary | ICD-10-CM | POA: Diagnosis not present

## 2014-04-02 DIAGNOSIS — E7800 Pure hypercholesterolemia, unspecified: Secondary | ICD-10-CM | POA: Diagnosis not present

## 2014-04-03 ENCOUNTER — Ambulatory Visit: Payer: BC Managed Care – PPO | Admitting: Nurse Practitioner

## 2014-04-10 ENCOUNTER — Encounter: Payer: Self-pay | Admitting: Nurse Practitioner

## 2014-04-10 ENCOUNTER — Encounter (INDEPENDENT_AMBULATORY_CARE_PROVIDER_SITE_OTHER): Payer: Self-pay

## 2014-04-10 ENCOUNTER — Ambulatory Visit (INDEPENDENT_AMBULATORY_CARE_PROVIDER_SITE_OTHER): Payer: Medicare Other | Admitting: Nurse Practitioner

## 2014-04-10 VITALS — BP 125/69 | HR 54 | Ht 66.0 in | Wt 230.0 lb

## 2014-04-10 DIAGNOSIS — R51 Headache: Secondary | ICD-10-CM

## 2014-04-10 DIAGNOSIS — F411 Generalized anxiety disorder: Secondary | ICD-10-CM | POA: Diagnosis not present

## 2014-04-10 NOTE — Patient Instructions (Addendum)
Continue current treatment for anxiety, as this has helped your headache.  You may follow up in our office as needed if headaches, recommend follow up in 6 months with Dr. Anne HahnWillis.

## 2014-04-10 NOTE — Progress Notes (Signed)
PATIENT: Paula Dunn DOB: 1951-03-16  REASON FOR VISIT: follow up for headaches HISTORY FROM: patient  HISTORY OF PRESENT ILLNESS: Paula Dunn is a 63 year old right-handed black female with a history of onset headache that began in 6 weeks prior to this evaluation. The patient indicates that she has never really had headaches during her lifetime, but 6 weeks ago, the patient noted relatively sudden onset of headache when she tried to sit up after taking a nap. The patient has had headaches throughout the head, without associated neck stiffness. The patient denies any low grade fevers or chills. The patient did develop photophobia and phonophobia with the headache, and she may have occasional episodes of flashing lights in her vision. The patient has had some persistence of the headache, but the headache has waxed and waned in severity over the last 6 weeks. The patient has some numbness of the hands, she may have some occasional nausea with the headache. The patient denies any focal weakness of the arms or legs, and she denies problems controlling the bowels or the bladder. With ambulation, the patient may have a sensation of veering to the right. The patient denies any falls. The patient does have some sinus drainage at times. The patient underwent a CT scan of the brain that was unremarkable. These images were reviewed on line. The patient is sent to this office for further evaluation. The patient been placed on amitriptyline, currently taking 12.5 mg at night. The patient is tolerating this medication fairly well.  UPDATE 04/10/14 (LL):  Since last visit, she has been much better in the last 6 weeks.  She did not find amitriptyline beneficial and stopped altogether.  Prednisone dose pack was not beneficial either.  She began taking her Lorazepam on a routine schedule and the headaches subsided.  She states she has a history of anxiety and claustrophobia, with Lorazepam as needed.  She states at the  time that the headaches started, she was experiencing a lot of stress.  ROS:  Out of a complete 14 system review of symptoms, the patient complains only of the following symptoms, and all other reviewed systems are negative.  Ringing in the ears, ear pain itching  Eye discharge, eye itching, eye pain Numbness, dizziness  Anxiety, decreased energy, change in appetite Insomnia, snoring  frequent waking  ALLERGIES: No Known Allergies  HOME MEDICATIONS: Outpatient Prescriptions Prior to Visit  Medication Sig Dispense Refill  . aspirin 81 MG tablet Take 81 mg by mouth daily.      . bisoprolol (ZEBETA) 5 MG tablet Take 5 mg by mouth daily.      . Cholecalciferol (VITAMIN D) 2000 UNITS CAPS Take 1 capsule by mouth daily.      . fluticasone (FLONASE) 50 MCG/ACT nasal spray Place 2 sprays into both nostrils daily.      Marland Kitchen. loratadine (CLARITIN) 10 MG tablet Take 10 mg by mouth daily as needed for allergies.      Marland Kitchen. LORazepam (ATIVAN) 0.5 MG tablet Take 0.5 mg by mouth every 8 (eight) hours as needed for anxiety (FOR ANXIETY).      . metFORMIN (GLUCOPHAGE) 500 MG tablet Take 500 mg by mouth 2 (two) times daily with a meal.      . pravastatin (PRAVACHOL) 40 MG tablet Take 40 mg by mouth daily.      Marland Kitchen. amitriptyline (ELAVIL) 25 MG tablet Take 25 mg by mouth at bedtime.       . predniSONE (DELTASONE) 5 MG  tablet Began taking 6 tablets daily, taper by one tablet daily until off the medication.  21 tablet  0   No facility-administered medications prior to visit.     PHYSICAL EXAM  Filed Vitals:   04/10/14 1100  BP: 125/69  Pulse: 54  Height: 5\' 6"  (1.676 m)  Weight: 230 lb (104.327 kg)   Body mass index is 37.14 kg/(m^2). No exam data present   Physical Exam  General: The patient is alert and cooperative at the time of the examination. The patient is moderately obese.  Eyes: Pupils are equal, round, and reactive to light.  Neck: The neck is supple, no carotid bruits are noted.    Respiratory: The respiratory examination is clear.  Cardiovascular: The cardiovascular examination reveals a regular rate and rhythm, no obvious murmurs or rubs are noted.  Neuromuscular: Range of movement of cervical spine is full. The patient has no crepitus in the temporomandibular joints.  Skin: Extremities are without significant edema.   Neurologic Exam  Mental status: The patient is alert and oriented x 3 at the time of the examination. The patient has apparent normal recent and remote memory, with an apparently normal attention span and concentration ability.  Cranial nerves: Facial symmetry is present. There is good sensation of the face to pinprick and soft touch bilaterally. The strength of the facial muscles and the muscles to head turning and shoulder shrug are normal bilaterally. Speech is well enunciated, no aphasia or dysarthria is noted. Extraocular movements are full. Visual fields are full. The tongue is midline, and the patient has symmetric elevation of the soft palate. No obvious hearing deficits are noted.  Motor: The motor testing reveals 5 over 5 strength of all 4 extremities. Good symmetric motor tone is noted throughout.  Sensory: Sensory testing is intact to pinprick, soft touch, vibration sensation, and position sense on all 4 extremities. No evidence of extinction is noted.  Coordination: Cerebellar testing reveals good finger-nose-finger and heel-to-shin bilaterally.  Gait and station: Gait is normal. Tandem gait is normal. Romberg is negative. No drift is seen.  Reflexes: Deep tendon reflexes are symmetric, but are depressed bilaterally. Toes are downgoing bilaterally.    ASSESSMENT AND PLAN 63 y.o. year old female  has a past medical history of Diabetes mellitus; Diverticulosis (12/2003); Obesity; Hyperlipidemia; Hypertension; Osteopenia; History of UTI; History of hematuria; Heel spur; and Headache(784.0) (01/02/2014) here with:  1. Headache, possible migraine   2. Generalized anxiety disorder  The patient has no prior history of migraine, and she has had onset of frequent headaches in February. CT was unremarkable.  She did not find amitriptyline beneficial and stopped altogether.  Prednisone dose pack was not beneficial either.  She began taking her Lorazepam on a routine schedule and the headaches subsided.  She is feeling much better in the last month.  The patient will followup in 6 months, sooner as needed.  Ronal FearLYNN E. Phillipe Clemon, MSN, NP-C 04/10/2014, 11:25 AM [facility] [address], [address] [place], [place] [address] [phone]  Note: This document was prepared with digital dictation and possible smart phrase technology. Any transcriptional errors that result from this process are unintentional.

## 2014-04-10 NOTE — Progress Notes (Signed)
I have read the note, and I agree with the clinical assessment and plan.  Charles K Willis   

## 2014-04-12 DIAGNOSIS — K5900 Constipation, unspecified: Secondary | ICD-10-CM | POA: Diagnosis not present

## 2014-04-12 DIAGNOSIS — Z1211 Encounter for screening for malignant neoplasm of colon: Secondary | ICD-10-CM | POA: Diagnosis not present

## 2014-04-12 DIAGNOSIS — D509 Iron deficiency anemia, unspecified: Secondary | ICD-10-CM | POA: Diagnosis not present

## 2014-04-27 DIAGNOSIS — H40029 Open angle with borderline findings, high risk, unspecified eye: Secondary | ICD-10-CM | POA: Diagnosis not present

## 2014-05-14 DIAGNOSIS — K319 Disease of stomach and duodenum, unspecified: Secondary | ICD-10-CM | POA: Diagnosis not present

## 2014-05-14 DIAGNOSIS — D509 Iron deficiency anemia, unspecified: Secondary | ICD-10-CM | POA: Diagnosis not present

## 2014-05-14 DIAGNOSIS — K2970 Gastritis, unspecified, without bleeding: Secondary | ICD-10-CM | POA: Diagnosis not present

## 2014-05-14 DIAGNOSIS — R7989 Other specified abnormal findings of blood chemistry: Secondary | ICD-10-CM | POA: Diagnosis not present

## 2014-05-14 DIAGNOSIS — K209 Esophagitis, unspecified without bleeding: Secondary | ICD-10-CM | POA: Diagnosis not present

## 2014-05-14 DIAGNOSIS — K2990 Gastroduodenitis, unspecified, without bleeding: Secondary | ICD-10-CM | POA: Diagnosis not present

## 2014-05-14 DIAGNOSIS — Z1211 Encounter for screening for malignant neoplasm of colon: Secondary | ICD-10-CM | POA: Diagnosis not present

## 2014-07-24 DIAGNOSIS — Z23 Encounter for immunization: Secondary | ICD-10-CM | POA: Diagnosis not present

## 2014-07-24 DIAGNOSIS — G609 Hereditary and idiopathic neuropathy, unspecified: Secondary | ICD-10-CM | POA: Diagnosis not present

## 2014-09-03 ENCOUNTER — Other Ambulatory Visit: Payer: Self-pay

## 2014-09-03 DIAGNOSIS — Z1231 Encounter for screening mammogram for malignant neoplasm of breast: Secondary | ICD-10-CM

## 2014-09-27 ENCOUNTER — Ambulatory Visit: Payer: BC Managed Care – PPO

## 2014-10-10 ENCOUNTER — Encounter: Payer: Self-pay | Admitting: Neurology

## 2014-10-15 ENCOUNTER — Ambulatory Visit (INDEPENDENT_AMBULATORY_CARE_PROVIDER_SITE_OTHER): Payer: Medicare Other | Admitting: Neurology

## 2014-10-15 ENCOUNTER — Encounter: Payer: Self-pay | Admitting: Neurology

## 2014-10-15 VITALS — BP 125/69 | HR 54 | Ht 67.0 in | Wt 226.8 lb

## 2014-10-15 DIAGNOSIS — R209 Unspecified disturbances of skin sensation: Secondary | ICD-10-CM | POA: Diagnosis not present

## 2014-10-15 DIAGNOSIS — R208 Other disturbances of skin sensation: Secondary | ICD-10-CM

## 2014-10-15 DIAGNOSIS — R200 Anesthesia of skin: Secondary | ICD-10-CM

## 2014-10-15 DIAGNOSIS — G44229 Chronic tension-type headache, not intractable: Secondary | ICD-10-CM

## 2014-10-15 HISTORY — DX: Anesthesia of skin: R20.0

## 2014-10-15 MED ORDER — GABAPENTIN 300 MG PO CAPS: 300.0000 mg | ORAL_CAPSULE | Freq: Every day | ORAL | Status: DC

## 2014-10-15 NOTE — Patient Instructions (Signed)

## 2014-10-15 NOTE — Progress Notes (Signed)
Reason for visit: Headache  Paula Dunn is an 63 y.o. female  History of present illness:  [name] is a 63 year old right-handed black female with a history of diabetes and a history of headache. The patient indicates that the use of lorazepam was very effective for her headache, and she essentially has not had any further events. She is using a 0.5 mg tablet at night. The patient however, comes into the office today indicating that over the last 6 months she has developed some numbness and tingling sensations as well as sharp pains in the feet. The patient has chronic insomnia, and she is not sure whether the foot issue is the primary issue that keeps her awake at night. The patient oftentimes notes some tingling in the hands as well. She denies any issues controlling the bowels or the bladder, and she does have some low back pain at times. She denies any significant neck discomfort. She has some slight imbalance issues, no recent falls. She comes for an evaluation of this issue.  Past Medical History  Diagnosis Date  . Diabetes mellitus   . Diverticulosis 12/2003  . Obesity   . Hyperlipidemia   . Hypertension   . Osteopenia   . History of UTI   . History of hematuria   . Heel spur   . Headache(784.0) 01/02/2014  . Numbness of feet 10/15/2014    Past Surgical History  Procedure Laterality Date  . Hammertoe    . Colonoscopy  12/2003  . Exploratory laparotomy      DR. DAVIS    BENIGN  . Esophagogastroduodenoscopy  12/2003  . Thyroid needle biopsy    . Varicose vein surgery  06/2009    Family History  Problem Relation Age of Onset  . Migraines Neg Hx     Social history:  reports that she has quit smoking. She has never used smokeless tobacco. She reports that she drinks alcohol. She reports that she does not use illicit drugs.   No Known Allergies  Medications:  Current Outpatient Prescriptions on File Prior to Visit  Medication Sig Dispense Refill  . aspirin 81  MG tablet Take 81 mg by mouth daily.    . bisoprolol (ZEBETA) 5 MG tablet Take 5 mg by mouth daily.    . Cholecalciferol (VITAMIN D) 2000 UNITS CAPS Take 1 capsule by mouth daily.    Marland Kitchen. loratadine (CLARITIN) 10 MG tablet Take 10 mg by mouth daily as needed for allergies.    Marland Kitchen. LORazepam (ATIVAN) 0.5 MG tablet Take 0.5 mg by mouth every 8 (eight) hours as needed for anxiety (FOR ANXIETY).    . metFORMIN (GLUCOPHAGE) 500 MG tablet Take 500 mg by mouth 2 (two) times daily with a meal.    . pravastatin (PRAVACHOL) 40 MG tablet Take 40 mg by mouth daily.     No current facility-administered medications on file prior to visit.    ROS:  Out of a complete 14 system review of symptoms, the patient complains only of the following symptoms, and all other reviewed systems are negative.  Eye itching Shortness of breath Chronic insomnia Joint pain, achy muscles Skin rash, itching Numbness  Blood pressure 125/69, pulse 54, height 5\' 7"  (1.702 m), weight 226 lb 12.8 oz (102.876 kg).  Physical Exam  General: The patient is alert and cooperative at the time of the examination. The patient is moderately to markedly obese.  Skin: No significant peripheral edema is noted. Varicose veins are noted in  both legs below the knees.   Neurologic Exam  Mental status: The patient is oriented x 3.  Cranial nerves: Facial symmetry is present. Speech is normal, no aphasia or dysarthria is noted. Extraocular movements are full. Visual fields are full.  Motor: The patient has good strength in all 4 extremities.  Sensory examination: Soft touch sensation is symmetric on the face, arms, and legs. There is a stocking pattern pinprick sensory deficit in both legs below the knees in the distal third of the leg. Tinel sign is positive at the wrist bilaterally.  Coordination: The patient has good finger-nose-finger and heel-to-shin bilaterally.  Gait and station: The patient has a normal gait. Tandem gait is slightly  unsteady. Romberg is negative. No drift is seen.  Reflexes: Deep tendon reflexes are symmetric, but are decreased.   Assessment/Plan:  1. History of headache, resolved  2. Foot numbness, possible peripheral neuropathy  This patient may have an early diabetic peripheral neuropathy developing. The patient will be sent for blood work today, and she will be sent for nerve conduction studies of both legs and one arm. EMG evaluation will be done on one leg. She will follow-up for the above study.  [name]. [name] MD 10/15/2014 9:48 AM  [facility] [address] [place], [place] [address]  Phone [phone] Fax [phone]

## 2014-10-16 ENCOUNTER — Encounter: Payer: Self-pay | Admitting: Neurology

## 2014-10-17 ENCOUNTER — Telehealth: Payer: Self-pay | Admitting: Neurology

## 2014-10-17 LAB — IFE AND PE, SERUM
ALPHA 1: 0.2 g/dL (ref 0.1–0.4)
ALPHA2 GLOB SERPL ELPH-MCNC: 1 g/dL (ref 0.4–1.2)
Albumin SerPl Elph-Mcnc: 4 g/dL (ref 3.2–5.6)
Albumin/Glob SerPl: 1.2 (ref 0.7–2.0)
B-GLOBULIN SERPL ELPH-MCNC: 1.1 g/dL (ref 0.6–1.3)
GAMMA GLOB SERPL ELPH-MCNC: 1.1 g/dL (ref 0.5–1.6)
Globulin, Total: 3.4 g/dL (ref 2.0–4.5)
IgA/Immunoglobulin A, Serum: 167 mg/dL (ref 91–414)
IgG (Immunoglobin G), Serum: 1223 mg/dL (ref 700–1600)
IgM (Immunoglobulin M), Srm: 116 mg/dL (ref 40–230)
Total Protein: 7.4 g/dL (ref 6.0–8.5)

## 2014-10-17 LAB — ANGIOTENSIN CONVERTING ENZYME: ANGIO CONVERT ENZYME: 73 U/L (ref 14–82)

## 2014-10-17 LAB — ANA W/REFLEX: Anti Nuclear Antibody(ANA): NEGATIVE

## 2014-10-17 LAB — RHEUMATOID FACTOR: RHEUMATOID FACTOR: 14.9 [IU]/mL — AB (ref 0.0–13.9)

## 2014-10-17 LAB — COPPER, SERUM: Copper: 122 ug/dL (ref 72–166)

## 2014-10-17 LAB — VITAMIN B12: Vitamin B-12: 416 pg/mL (ref 211–946)

## 2014-10-17 NOTE — Telephone Encounter (Signed)
I called the patient. The blood work was unremarkable with exception of a very minimal clinically insignificant elevation in the rheumatoid factor. The patient will follow-up for the EMG and nerve conduction study evaluation.

## 2014-10-24 ENCOUNTER — Ambulatory Visit
Admission: RE | Admit: 2014-10-24 | Discharge: 2014-10-24 | Disposition: A | Discharge status: Home / Self Care | Payer: Medicare Other | Source: Ambulatory Visit

## 2014-10-24 DIAGNOSIS — Z1231 Encounter for screening mammogram for malignant neoplasm of breast: Secondary | ICD-10-CM | POA: Diagnosis not present

## 2014-10-29 DIAGNOSIS — Z124 Encounter for screening for malignant neoplasm of cervix: Secondary | ICD-10-CM | POA: Diagnosis not present

## 2014-10-29 DIAGNOSIS — Z01419 Encounter for gynecological examination (general) (routine) without abnormal findings: Secondary | ICD-10-CM | POA: Diagnosis not present

## 2014-10-30 ENCOUNTER — Ambulatory Visit (INDEPENDENT_AMBULATORY_CARE_PROVIDER_SITE_OTHER): Payer: Medicare Other | Admitting: Neurology

## 2014-10-30 ENCOUNTER — Ambulatory Visit (INDEPENDENT_AMBULATORY_CARE_PROVIDER_SITE_OTHER): Payer: Self-pay | Admitting: Neurology

## 2014-10-30 DIAGNOSIS — R200 Anesthesia of skin: Secondary | ICD-10-CM

## 2014-10-30 DIAGNOSIS — R208 Other disturbances of skin sensation: Secondary | ICD-10-CM

## 2014-10-30 DIAGNOSIS — G44229 Chronic tension-type headache, not intractable: Secondary | ICD-10-CM

## 2014-10-30 NOTE — Progress Notes (Signed)
Please refer to EMG note. 

## 2014-10-30 NOTE — Progress Notes (Signed)
Ardeen FillersBrenda Dunn is a 63 year old patient with a history of obesity and diabetes. The patient has had a six-month history of numbness in the feet. She comes in for EMG and nerve conduction study.  Nerve conductions of the right arm and both legs were normal the right leg was normal. The patient is felt possibly to have an early peripheral neuropathy or a small fiber neuropathy.  At this point, the patient is on gabapentin taking 300 mg at night. She indicates that she is not having a lot of pain. She will follow-up through this office if she believes that the symptoms are worsening. The blood work done for an evaluation of the peripheral neuropathy was negative.

## 2014-10-30 NOTE — Procedures (Signed)
     HISTORY:  Paula FillersBrenda Dunn is a 63 year old patient with a two-year history of diabetes. The patient has reported some numbness and tingly sensations in the feet primarily at nighttime. She is being evaluated for a possible peripheral neuropathy.  NERVE CONDUCTION STUDIES:  Nerve conduction studies were performed on right upper extremity. The distal motor latencies and motor amplitudes for the median and ulnar nerves were within normal limits. The F wave latencies and nerve conduction velocities for these nerves were also normal. The sensory latencies for the median and ulnar nerves were normal.  Nerve conduction studies were performed on both lower extremities. The distal motor latencies and motor amplitudes for the peroneal and posterior tibial nerves were within normal limits. The nerve conduction velocities for these nerves were also normal. The H reflex latencies were normal. The sensory latencies for the peroneal nerves were within normal limits.   EMG STUDIES:  EMG study was performed on the right lower extremity:  The tibialis anterior muscle reveals 2 to 4K motor units with full recruitment. No fibrillations or positive waves were seen. The peroneus tertius muscle reveals 2 to 4K motor units with full recruitment. No fibrillations or positive waves were seen. The medial gastrocnemius muscle reveals 1 to 3K motor units with full recruitment. No fibrillations or positive waves were seen. The vastus lateralis muscle reveals 2 to 4K motor units with full recruitment. No fibrillations or positive waves were seen. The iliopsoas muscle reveals 2 to 4K motor units with full recruitment. No fibrillations or positive waves were seen. The biceps femoris muscle (long head) reveals 2 to 4K motor units with full recruitment. No fibrillations or positive waves were seen. The lumbosacral paraspinal muscles were tested at 3 levels, and revealed no abnormalities of insertional activity at all 3  levels tested. There was good relaxation.   IMPRESSION:  Nerve conduction studies done on the right upper extremity and both lower extremities were within normal limits. No evidence of a peripheral neuropathy is seen. A small fiber neuropathy or an early axonal peripheral neuropathy may be missed by nerve conduction studies, however. Clinical correlation is required. EMG evaluation of the right lower extremity is unremarkable, without evidence of an overlying lumbosacral radiculopathy.  [name]. Keith Abrea Henle MD 10/30/2014 1:54 PM  [facility] [address] [place], [place] [id number]  Phone [phone] Fax [phone]

## 2014-12-04 DIAGNOSIS — E118 Type 2 diabetes mellitus with unspecified complications: Secondary | ICD-10-CM | POA: Diagnosis not present

## 2014-12-04 DIAGNOSIS — I10 Essential (primary) hypertension: Secondary | ICD-10-CM | POA: Diagnosis not present

## 2014-12-04 DIAGNOSIS — N390 Urinary tract infection, site not specified: Secondary | ICD-10-CM | POA: Diagnosis not present

## 2014-12-11 DIAGNOSIS — D509 Iron deficiency anemia, unspecified: Secondary | ICD-10-CM | POA: Diagnosis not present

## 2014-12-11 DIAGNOSIS — E118 Type 2 diabetes mellitus with unspecified complications: Secondary | ICD-10-CM | POA: Diagnosis not present

## 2014-12-11 DIAGNOSIS — I10 Essential (primary) hypertension: Secondary | ICD-10-CM | POA: Diagnosis not present

## 2014-12-11 DIAGNOSIS — E780 Pure hypercholesterolemia: Secondary | ICD-10-CM | POA: Diagnosis not present

## 2014-12-19 DIAGNOSIS — Z1211 Encounter for screening for malignant neoplasm of colon: Secondary | ICD-10-CM | POA: Diagnosis not present

## 2014-12-19 DIAGNOSIS — K5730 Diverticulosis of large intestine without perforation or abscess without bleeding: Secondary | ICD-10-CM | POA: Diagnosis not present

## 2015-02-07 ENCOUNTER — Encounter (HOSPITAL_COMMUNITY): Payer: Self-pay

## 2015-02-07 ENCOUNTER — Ambulatory Visit (HOSPITAL_COMMUNITY): Admit: 2015-02-07 | Payer: Self-pay | Admitting: Gastroenterology

## 2015-02-07 SURGERY — COLONOSCOPY
Anesthesia: Moderate Sedation

## 2015-02-14 DIAGNOSIS — Z1211 Encounter for screening for malignant neoplasm of colon: Secondary | ICD-10-CM | POA: Diagnosis not present

## 2015-02-14 DIAGNOSIS — K5730 Diverticulosis of large intestine without perforation or abscess without bleeding: Secondary | ICD-10-CM | POA: Diagnosis not present

## 2015-03-12 DIAGNOSIS — E119 Type 2 diabetes mellitus without complications: Secondary | ICD-10-CM | POA: Diagnosis not present

## 2015-03-12 DIAGNOSIS — I10 Essential (primary) hypertension: Secondary | ICD-10-CM | POA: Diagnosis not present

## 2015-03-15 DIAGNOSIS — E780 Pure hypercholesterolemia: Secondary | ICD-10-CM | POA: Diagnosis not present

## 2015-03-15 DIAGNOSIS — R109 Unspecified abdominal pain: Secondary | ICD-10-CM | POA: Diagnosis not present

## 2015-03-15 DIAGNOSIS — E119 Type 2 diabetes mellitus without complications: Secondary | ICD-10-CM | POA: Diagnosis not present

## 2015-03-15 DIAGNOSIS — I10 Essential (primary) hypertension: Secondary | ICD-10-CM | POA: Diagnosis not present

## 2015-05-02 DIAGNOSIS — L309 Dermatitis, unspecified: Secondary | ICD-10-CM | POA: Diagnosis not present

## 2015-05-29 DIAGNOSIS — H43811 Vitreous degeneration, right eye: Secondary | ICD-10-CM | POA: Diagnosis not present

## 2015-05-29 DIAGNOSIS — H43393 Other vitreous opacities, bilateral: Secondary | ICD-10-CM | POA: Diagnosis not present

## 2015-05-29 DIAGNOSIS — N644 Mastodynia: Secondary | ICD-10-CM | POA: Diagnosis not present

## 2015-06-19 DIAGNOSIS — H40023 Open angle with borderline findings, high risk, bilateral: Secondary | ICD-10-CM | POA: Diagnosis not present

## 2015-06-19 DIAGNOSIS — H04123 Dry eye syndrome of bilateral lacrimal glands: Secondary | ICD-10-CM | POA: Diagnosis not present

## 2015-06-19 DIAGNOSIS — H1013 Acute atopic conjunctivitis, bilateral: Secondary | ICD-10-CM | POA: Diagnosis not present

## 2015-06-19 DIAGNOSIS — E119 Type 2 diabetes mellitus without complications: Secondary | ICD-10-CM | POA: Diagnosis not present

## 2015-08-16 IMAGING — CT CT HEAD W/O CM
2 series · 16 of 30 positions shown, 20 images · non-contrast
Comparison: None.

CLINICAL DATA: Headaches, dizziness

EXAM:
CT HEAD WITHOUT CONTRAST
TECHNIQUE: Contiguous axial images were obtained from the base of the skull
through the vertex without intravenous contrast.

[Series 2: head w/o · axial · non-contrast · 0.49mm/px · z∈[+24,+152]mm · 13 of 28 slices shown, 17 images]
[im 2/28  brain]
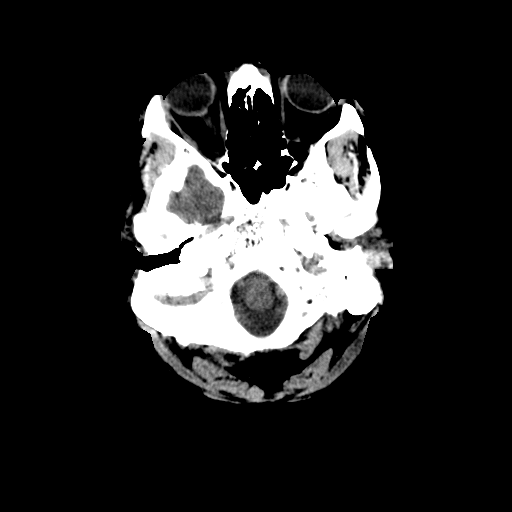
[im 2/28  bone]
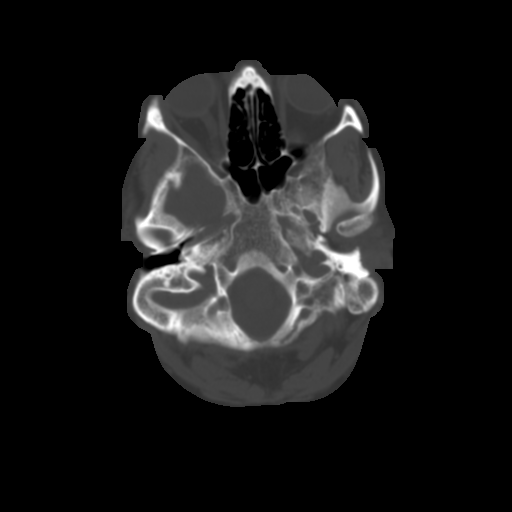
[im 4/28  brain]
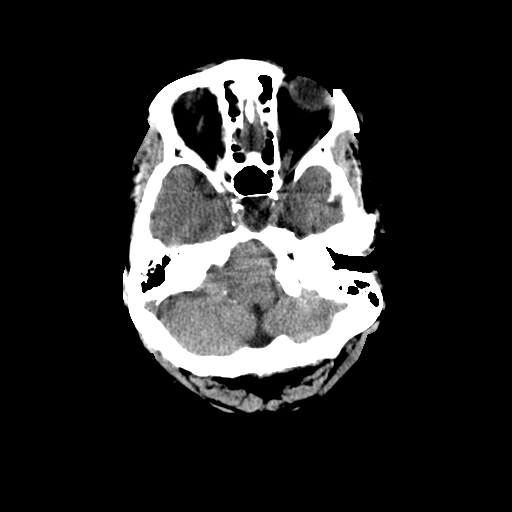
[im 6/28  brain]
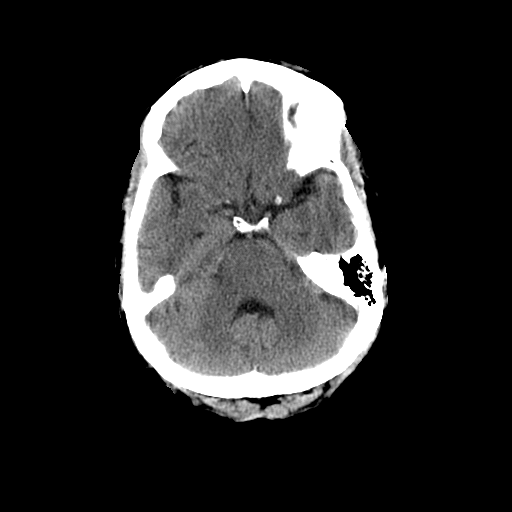
[im 8/28  brain]
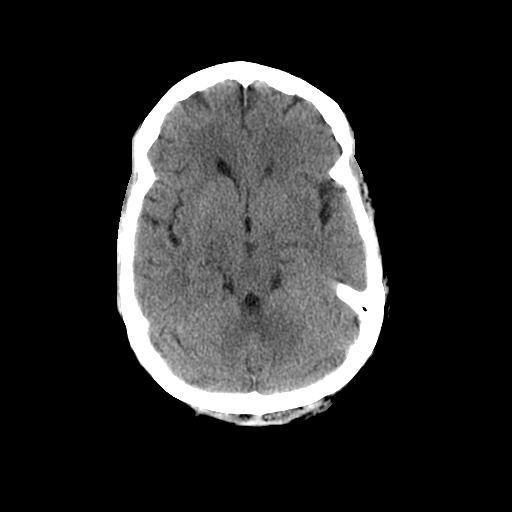
[im 10/28  brain]
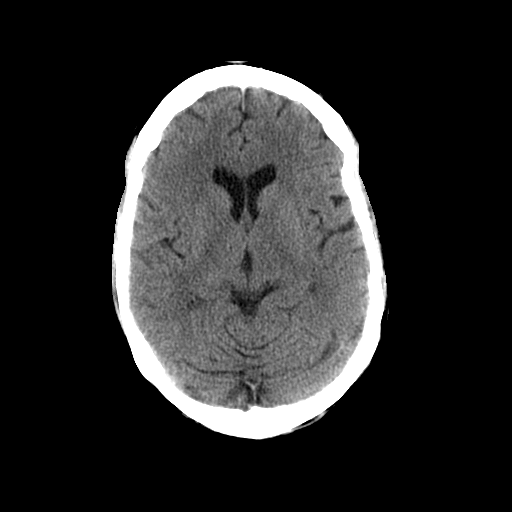
[im 10/28  bone]
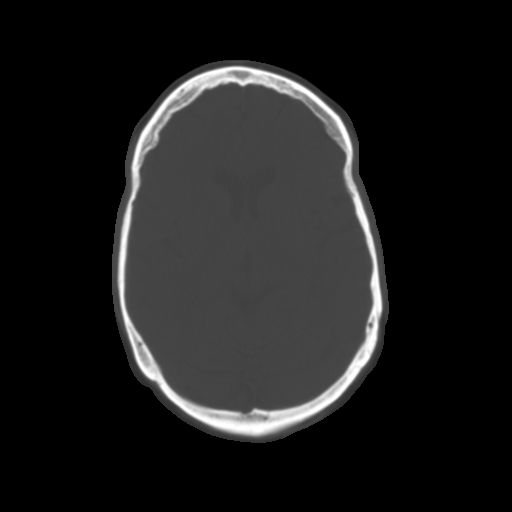
[im 12/28  brain]
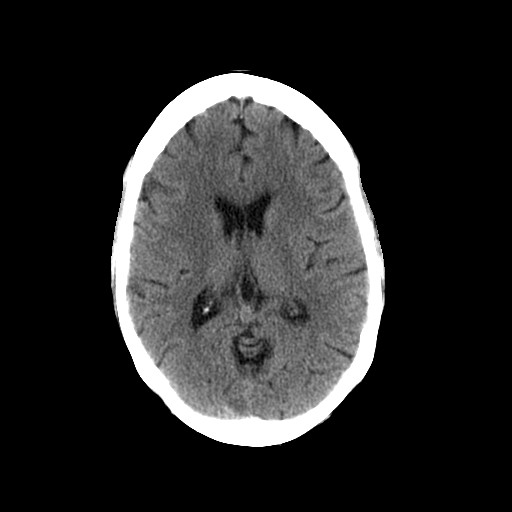
[im 14/28  brain]
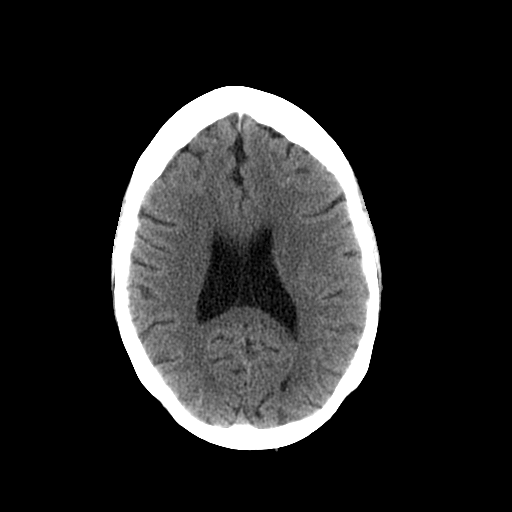
[im 16/28  brain]
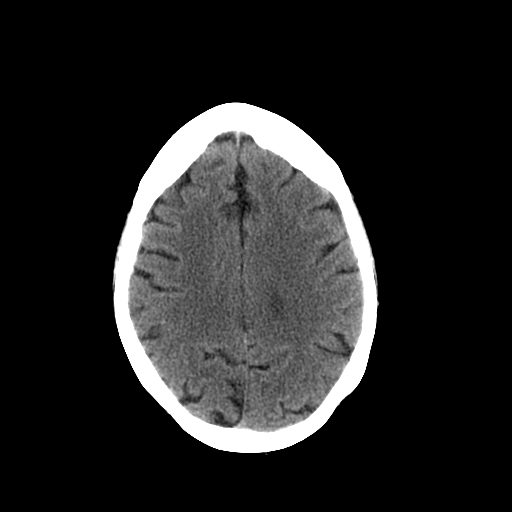
[im 18/28  brain]
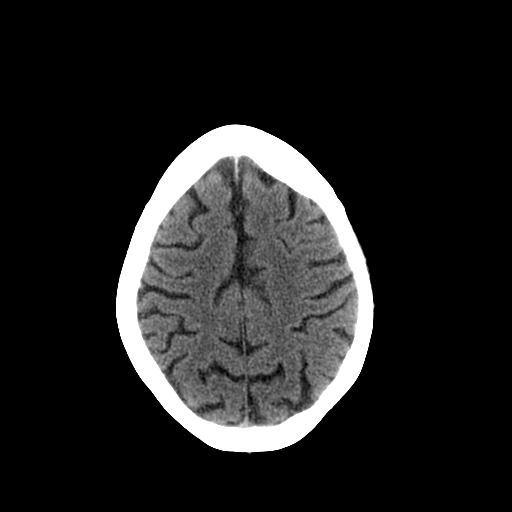
[im 18/28  bone]
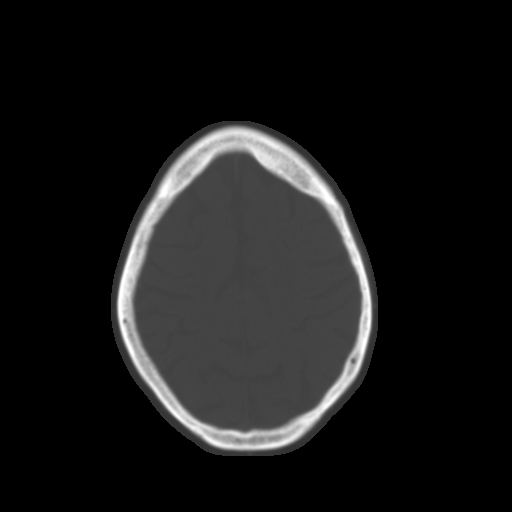
[im 20/28  brain]
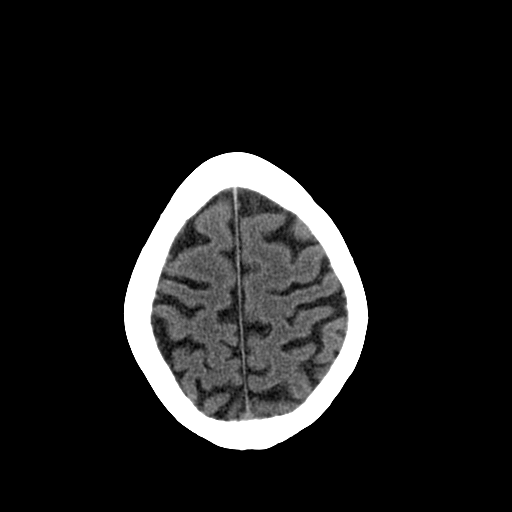
[im 22/28  brain]
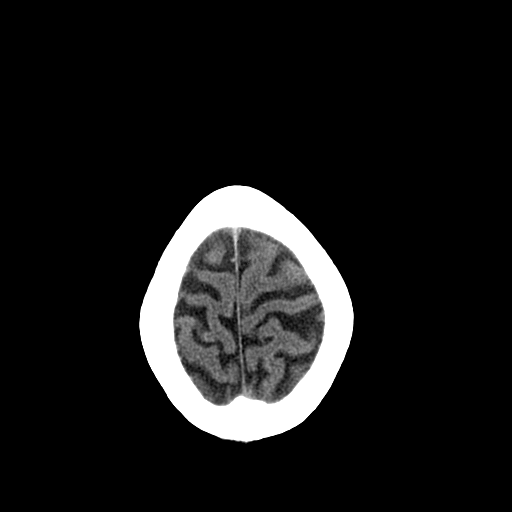
[im 24/28  brain]
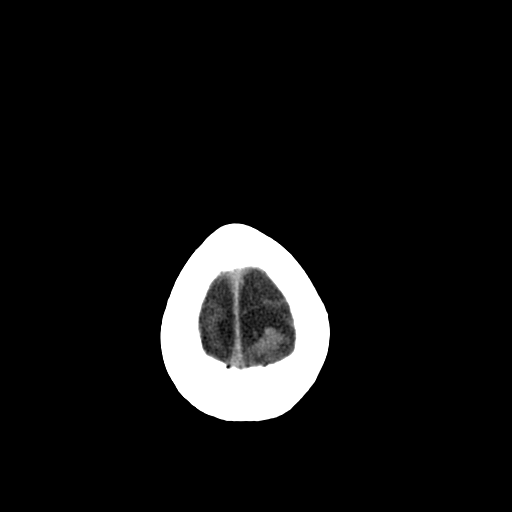
[im 26/28  brain]
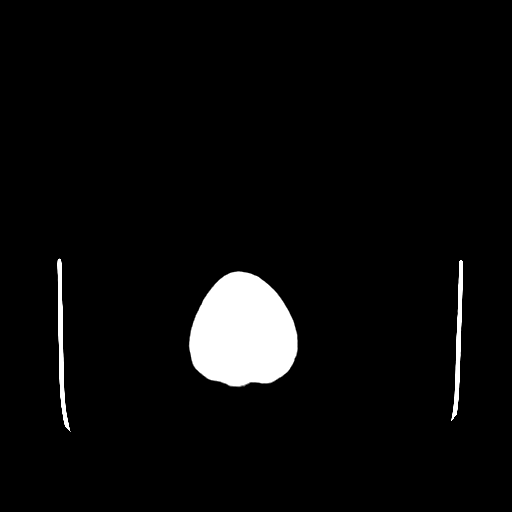
[im 26/28  bone]
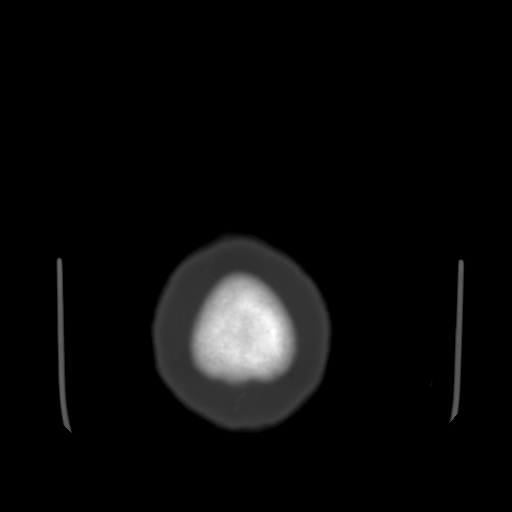

[Series 3: head bone · axial · 0.49mm/px · z∈[+24,+67]mm · 3 of 28 slices shown]
[im 2/28  bone]
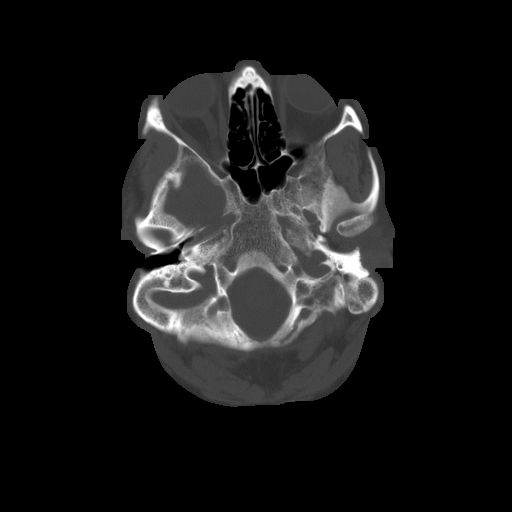
[im 6/28  bone]
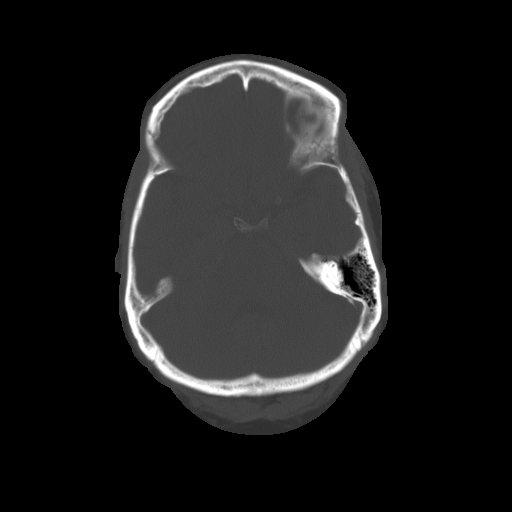
[im 10/28  bone]
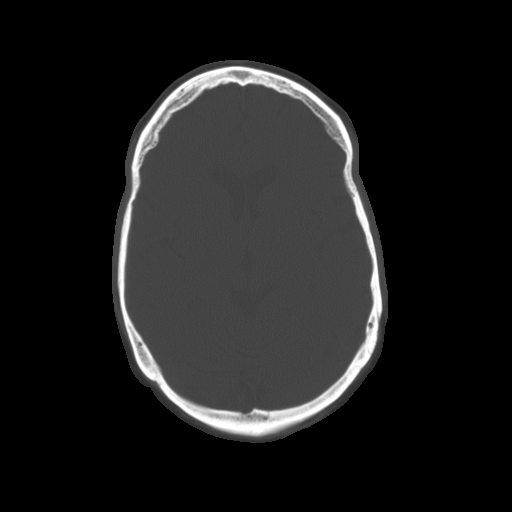

[16 of 30 positions shown; findings below may reference images not displayed]

FINDINGS: Atherosclerotic and physiologic intracranial calcifications. Mild
atrophy. There is no evidence of acute intracranial hemorrhage,
brain edema, mass lesion, acute infarction, mass effect, or midline
shift. Acute infarct may be inapparent on noncontrast CT. No other
intra-axial abnormalities are seen, and the ventricles and sulci are
within normal limits in size and symmetry. No abnormal extra-axial
fluid collections or masses are identified. No significant calvarial
abnormality.
IMPRESSION: 1. Negative for bleed or other acute intracranial process.

## 2015-09-06 DIAGNOSIS — E119 Type 2 diabetes mellitus without complications: Secondary | ICD-10-CM | POA: Diagnosis not present

## 2015-09-06 DIAGNOSIS — I10 Essential (primary) hypertension: Secondary | ICD-10-CM | POA: Diagnosis not present

## 2015-09-13 DIAGNOSIS — E7800 Pure hypercholesterolemia, unspecified: Secondary | ICD-10-CM | POA: Diagnosis not present

## 2015-09-13 DIAGNOSIS — I10 Essential (primary) hypertension: Secondary | ICD-10-CM | POA: Diagnosis not present

## 2015-09-13 DIAGNOSIS — Z23 Encounter for immunization: Secondary | ICD-10-CM | POA: Diagnosis not present

## 2015-09-13 DIAGNOSIS — E119 Type 2 diabetes mellitus without complications: Secondary | ICD-10-CM | POA: Diagnosis not present

## 2015-09-13 DIAGNOSIS — G629 Polyneuropathy, unspecified: Secondary | ICD-10-CM | POA: Diagnosis not present

## 2015-10-02 ENCOUNTER — Other Ambulatory Visit: Payer: Self-pay

## 2015-10-02 DIAGNOSIS — Z1231 Encounter for screening mammogram for malignant neoplasm of breast: Secondary | ICD-10-CM

## 2015-11-12 ENCOUNTER — Ambulatory Visit: Payer: PRIVATE HEALTH INSURANCE

## 2016-01-09 ENCOUNTER — Ambulatory Visit
Admission: RE | Admit: 2016-01-09 | Discharge: 2016-01-09 | Disposition: A | Discharge status: Home / Self Care | Payer: Medicare Other | Source: Ambulatory Visit

## 2016-01-09 DIAGNOSIS — Z1231 Encounter for screening mammogram for malignant neoplasm of breast: Secondary | ICD-10-CM

## 2016-03-05 DIAGNOSIS — G629 Polyneuropathy, unspecified: Secondary | ICD-10-CM | POA: Diagnosis not present

## 2016-03-05 DIAGNOSIS — I10 Essential (primary) hypertension: Secondary | ICD-10-CM | POA: Diagnosis not present

## 2016-03-05 DIAGNOSIS — E119 Type 2 diabetes mellitus without complications: Secondary | ICD-10-CM | POA: Diagnosis not present

## 2016-12-11 ENCOUNTER — Other Ambulatory Visit: Payer: Self-pay | Admitting: Obstetrics & Gynecology

## 2016-12-11 DIAGNOSIS — Z1231 Encounter for screening mammogram for malignant neoplasm of breast: Secondary | ICD-10-CM

## 2017-01-19 ENCOUNTER — Ambulatory Visit: Payer: Medicare Other

## 2017-02-02 ENCOUNTER — Ambulatory Visit
Admission: RE | Admit: 2017-02-02 | Discharge: 2017-02-02 | Disposition: A | Discharge status: Home / Self Care | Payer: Medicare Other | Source: Ambulatory Visit | Attending: Obstetrics & Gynecology | Admitting: Obstetrics & Gynecology

## 2017-02-02 DIAGNOSIS — Z1231 Encounter for screening mammogram for malignant neoplasm of breast: Secondary | ICD-10-CM

## 2018-01-04 ENCOUNTER — Other Ambulatory Visit: Payer: Self-pay | Admitting: Obstetrics & Gynecology

## 2018-01-04 ENCOUNTER — Other Ambulatory Visit: Payer: Self-pay | Admitting: Internal Medicine

## 2018-01-04 DIAGNOSIS — Z1231 Encounter for screening mammogram for malignant neoplasm of breast: Secondary | ICD-10-CM

## 2018-02-04 ENCOUNTER — Ambulatory Visit
Admission: RE | Admit: 2018-02-04 | Discharge: 2018-02-04 | Disposition: A | Discharge status: Home / Self Care | Payer: Medicare Other | Source: Ambulatory Visit | Attending: Internal Medicine | Admitting: Internal Medicine

## 2018-02-04 DIAGNOSIS — Z1231 Encounter for screening mammogram for malignant neoplasm of breast: Secondary | ICD-10-CM

## 2018-12-30 ENCOUNTER — Other Ambulatory Visit: Payer: Self-pay | Admitting: Internal Medicine

## 2018-12-30 DIAGNOSIS — Z1231 Encounter for screening mammogram for malignant neoplasm of breast: Secondary | ICD-10-CM

## 2019-02-07 ENCOUNTER — Ambulatory Visit: Payer: Medicare Other

## 2019-03-03 ENCOUNTER — Ambulatory Visit: Payer: Medicare Other

## 2019-04-26 ENCOUNTER — Ambulatory Visit: Payer: Medicare Other

## 2019-06-13 ENCOUNTER — Ambulatory Visit: Payer: Medicare Other

## 2019-07-28 ENCOUNTER — Ambulatory Visit: Payer: Medicare Other

## 2019-08-29 ENCOUNTER — Ambulatory Visit: Payer: BC Managed Care – PPO | Admitting: Obstetrics & Gynecology

## 2019-09-11 ENCOUNTER — Ambulatory Visit: Payer: BC Managed Care – PPO | Admitting: Obstetrics & Gynecology

## 2019-12-27 DIAGNOSIS — N950 Postmenopausal bleeding: Secondary | ICD-10-CM | POA: Diagnosis not present

## 2020-01-22 ENCOUNTER — Other Ambulatory Visit: Payer: Self-pay

## 2020-01-22 ENCOUNTER — Encounter: Payer: Self-pay | Admitting: Podiatry

## 2020-01-22 ENCOUNTER — Ambulatory Visit: Payer: Medicare PPO | Admitting: Podiatry

## 2020-01-22 ENCOUNTER — Ambulatory Visit: Payer: Self-pay | Attending: Internal Medicine

## 2020-01-22 VITALS — BP 160/70

## 2020-01-22 DIAGNOSIS — M2042 Other hammer toe(s) (acquired), left foot: Secondary | ICD-10-CM

## 2020-01-22 DIAGNOSIS — E1142 Type 2 diabetes mellitus with diabetic polyneuropathy: Secondary | ICD-10-CM

## 2020-01-22 DIAGNOSIS — M79675 Pain in left toe(s): Secondary | ICD-10-CM | POA: Diagnosis not present

## 2020-01-22 DIAGNOSIS — M79674 Pain in right toe(s): Secondary | ICD-10-CM

## 2020-01-22 DIAGNOSIS — B351 Tinea unguium: Secondary | ICD-10-CM

## 2020-01-22 DIAGNOSIS — E119 Type 2 diabetes mellitus without complications: Secondary | ICD-10-CM

## 2020-01-22 DIAGNOSIS — M2041 Other hammer toe(s) (acquired), right foot: Secondary | ICD-10-CM | POA: Diagnosis not present

## 2020-01-22 DIAGNOSIS — L84 Corns and callosities: Secondary | ICD-10-CM

## 2020-01-22 NOTE — Patient Instructions (Signed)
Arthritis °Arthritis is a term that is commonly used to refer to joint pain or joint disease. There are more than 100 types of arthritis. °What are the causes? °The most common cause of this condition is wear and tear of a joint. Other causes include: °· Gout. °· Inflammation of a joint. °· An infection of a joint. °· Sprains and other injuries near the joint. °· A reaction to medicines or drugs, or an allergic reaction. °In some cases, the cause may not be known. °What are the signs or symptoms? °The main symptom of this condition is pain in the joint during movement. Other symptoms include: °· Redness, swelling, or stiffness at a joint. °· Warmth coming from the joint. °· Fever. °· Overall feeling of illness. °How is this diagnosed? °This condition may be diagnosed with a physical exam and tests, including: °· Blood tests. °· Urine tests. °· Imaging tests, such as X-rays, an MRI, or a CT scan. °Sometimes, fluid is removed from a joint for testing. °How is this treated? °This condition may be treated with: °· Treatment of the cause, if it is known. °· Rest. °· Raising (elevating) the joint. °· Applying cold or hot packs to the joint. °· Medicines to improve symptoms and reduce inflammation. °· Injections of a steroid such as cortisone into the joint to help reduce pain and inflammation. °Depending on the cause of your arthritis, you may need to make lifestyle changes to reduce stress on your joint. Changes may include: °· Exercising more. °· Losing weight. °Follow these instructions at home: °Medicines °· Take over-the-counter and prescription medicines only as told by your health care provider. °· Do not take aspirin to relieve pain if your health care provider thinks that gout may be causing your pain. °Activity °· Rest your joint if told by your health care provider. Rest is important when your disease is active and your joint feels painful, swollen, or stiff. °· Avoid activities that make the pain worse. It is  important to balance activity with rest. °· Exercise your joint regularly with range-of-motion exercises as told by your health care provider. Try doing low-impact exercise, such as: °? Swimming. °? Water aerobics. °? Biking. °? Walking. °Managing pain, stiffness, and swelling ° °  ° °· If directed, put ice on the joint. °? Put ice in a plastic bag. °? Place a towel between your skin and the bag. °? Leave the ice on for 20 minutes, 2-3 times per day. °· If your joint is swollen, raise (elevate) it above the level of your heart if directed by your health care provider. °· If your joint feels stiff in the morning, try taking a warm shower. °· If directed, apply heat to the affected area as often as told by your health care provider. Use the heat source that your health care provider recommends, such as a moist heat pack or a heating pad. If you have diabetes, do not apply heat without permission from your health care provider. To apply heat: °? Place a towel between your skin and the heat source. °? Leave the heat on for 20-30 minutes. °? Remove the heat if your skin turns bright red. This is especially important if you are unable to feel pain, heat, or cold. You may have a greater risk of getting burned. °General instructions °· Do not use any products that contain nicotine or tobacco, such as cigarettes, e-cigarettes, and chewing tobacco. If you need help quitting, ask your health care provider. °· Keep   all follow-up visits as told by your health care provider. This is important. °Contact a health care provider if: °· The pain gets worse. °· You have a fever. °Get help right away if: °· You develop severe joint pain, swelling, or redness. °· Many joints become painful and swollen. °· You develop severe back pain. °· You develop severe weakness in your leg. °· You cannot control your bladder or bowels. °Summary °· Arthritis is a term that is commonly used to refer to joint pain or joint disease. There are more than  100 types of arthritis. °· The most common cause of this condition is wear and tear of a joint. Other causes include gout, inflammation or infection of the joint, sprains, or allergies. °· Symptoms of this condition include redness, swelling, or stiffness of the joint. Other symptoms include warmth, fever, or feeling ill. °· This condition is treated with rest, elevation, medicines, and applying cold or hot packs. °· Follow your health care provider's instructions about medicines, activity, exercises, and other home care treatments. °This information is not intended to replace advice given to you by your health care provider. Make sure you discuss any questions you have with your health care provider. °Document Revised: 10/17/2018 Document Reviewed: 10/17/2018 °Elsevier Patient Education © 2020 [employer] Inc. ° °

## 2020-01-22 NOTE — Progress Notes (Signed)
Subjective: Paula Dunn presents today referred by Paula Reichmann, DO for complaint of for diabetic foot evaluation and painful mycotic nails b/l that are difficult to trim. Pain interferes with ambulation. Aggravating factors include wearing enclosed shoe gear. Pain is relieved with periodic professional debridement.   She relates she has been seen by our practice years ago. She has h/o surgical correction of lesser digits. She relates she has been diabetic for several years. Patient states she does not take Metformin anymore. She takes cinnamon twice daily. Also does not check sugar. She denies any history of foot wounds.  Her new PCP is Dr. Irena Dunn.   Past Medical History:  Diagnosis Date  . Diabetes mellitus   . Diverticulosis 12/2003  . Headache(784.0) 01/02/2014  . Heel spur   . History of hematuria   . History of UTI   . Hyperlipidemia   . Hypertension   . Numbness of feet 10/15/2014  . Obesity   . Osteopenia      Patient Active Problem List   Diagnosis Date Noted  . Numbness of feet 10/15/2014  . Generalized anxiety disorder 04/10/2014  . Headache 01/02/2014     Past Surgical History:  Procedure Laterality Date  . COLONOSCOPY  12/2003  . ESOPHAGOGASTRODUODENOSCOPY  12/2003  . EXPLORATORY LAPAROTOMY     DR. DAVIS    BENIGN  . HAMMERTOE    . THYROID NEEDLE BIOPSY    . VARICOSE VEIN SURGERY  06/2009      Current Outpatient Medications on File Prior to Visit  Medication Sig  . aspirin 81 MG tablet Take 81 mg by mouth daily.  . bisoprolol (ZEBETA) 5 MG tablet Take 5 mg by mouth daily.  . Cholecalciferol (VITAMIN D) 2000 UNITS CAPS Take 1 capsule by mouth daily.  Marland Kitchen gabapentin (NEURONTIN) 300 MG capsule Take 1 capsule (300 mg total) by mouth at bedtime.  Marland Kitchen loratadine (CLARITIN) 10 MG tablet Take 10 mg by mouth daily as needed for allergies.  Marland Kitchen omeprazole (PRILOSEC) 20 MG capsule   . pravastatin (PRAVACHOL) 40 MG tablet Take 40 mg by mouth daily.  Marland Kitchen LORazepam  (ATIVAN) 0.5 MG tablet Take 0.5 mg by mouth every 8 (eight) hours as needed for anxiety (FOR ANXIETY).   No current facility-administered medications on file prior to visit.    No Known Allergies   Social History   Occupational History  . Occupation: Banker: [employer]  Tobacco Use  . Smoking status: Former Smoker    Packs/day: 0.50    Years: 20.00    Pack years: 10.00  . Smokeless tobacco: Never Used  . Tobacco comment: quit 15 years  Substance and Sexual Activity  . Alcohol use: Yes    Comment: OCCASIONAL  . Drug use: No  . Sexual activity: Not on file     Family History  Problem Relation Age of Onset  . Breast cancer Sister   . Migraines Neg Hx       There is no immunization history on file for this patient.   Objective: Vitals:   01/22/20 0857  BP: (!) 160/70    Vascular Examination:  Capillary refill time to digits immediate b/l, palpable DP pulses b/l, palpable PT pulses b/l, pedal hair present b/l and skin temperature gradient within normal limits b/l  Dermatological Examination: Pedal skin with normal turgor, texture and tone bilaterally, no open wounds bilaterally, no interdigital macerations bilaterally, toenails 1-5 b/l elongated, dystrophic, thickened, crumbly with subungual  debris and hyperkeratotic lesion(s) submet heads 1, 5 b/l and submet head 4 b/l.  No erythema, no edema, no drainage, no flocculence  Musculoskeletal: Normal muscle strength 5/5 to all lower extremity muscle groups bilaterally, no pain crepitus or joint limitation noted with ROM b/l and hammertoes noted to the  2-5 bilaterally  Neurological: Protective sensation intact 5/5 intact bilaterally with 10g monofilament b/l and vibratory sensation intact b/l  Assessment: 1. Pain due to onychomycosis of toenails of both feet   2. Callus   3. Diabetic peripheral neuropathy associated with type 2 diabetes mellitus (Paula Dunn)   4. Acquired hammertoes of both  feet   5. Encounter for diabetic foot exam (Paula Dunn)     Plan: -Diabetic foot examination performed on today's visit. -Patient had 2 elevated blood pressures with no symptoms of headache, blurred vision or dizziness. She relates anxiety and stress. She was evaluated by our RN, Paula Dunn and third BP was 160/70. She was instructed to record her BP daily and report to her PCP.  -Toenails 1-5 b/l were debrided in length and girth with sterile nail nippers and dremel without iatrogenic bleeding. -Calluses were debrided without complication or incident. Total number debrided =6, submet heads 1, 5 b/l and submet head 4 b/l -Patient to continue soft, supportive shoe gear daily. -Patient to report any pedal injuries to medical professional immediately. -Patient/POA to call should there be question/concern in the interim.  Return in about 3 months (around 04/23/2020) for diabetic nail trim.

## 2020-02-01 DIAGNOSIS — I10 Essential (primary) hypertension: Secondary | ICD-10-CM | POA: Diagnosis not present

## 2020-02-01 DIAGNOSIS — E1140 Type 2 diabetes mellitus with diabetic neuropathy, unspecified: Secondary | ICD-10-CM | POA: Diagnosis not present

## 2020-02-08 DIAGNOSIS — Z0000 Encounter for general adult medical examination without abnormal findings: Secondary | ICD-10-CM | POA: Diagnosis not present

## 2020-02-08 DIAGNOSIS — K5900 Constipation, unspecified: Secondary | ICD-10-CM | POA: Diagnosis not present

## 2020-02-08 DIAGNOSIS — E559 Vitamin D deficiency, unspecified: Secondary | ICD-10-CM | POA: Diagnosis not present

## 2020-02-08 DIAGNOSIS — I10 Essential (primary) hypertension: Secondary | ICD-10-CM | POA: Diagnosis not present

## 2020-02-08 DIAGNOSIS — R079 Chest pain, unspecified: Secondary | ICD-10-CM | POA: Diagnosis not present

## 2020-02-08 DIAGNOSIS — F419 Anxiety disorder, unspecified: Secondary | ICD-10-CM | POA: Diagnosis not present

## 2020-02-08 DIAGNOSIS — E1140 Type 2 diabetes mellitus with diabetic neuropathy, unspecified: Secondary | ICD-10-CM | POA: Diagnosis not present

## 2020-02-08 DIAGNOSIS — K219 Gastro-esophageal reflux disease without esophagitis: Secondary | ICD-10-CM | POA: Diagnosis not present

## 2020-02-08 DIAGNOSIS — E7800 Pure hypercholesterolemia, unspecified: Secondary | ICD-10-CM | POA: Diagnosis not present

## 2020-04-23 ENCOUNTER — Ambulatory Visit: Payer: Medicare Other | Admitting: Podiatry

## 2020-07-16 DIAGNOSIS — M545 Low back pain: Secondary | ICD-10-CM | POA: Diagnosis not present

## 2020-07-16 DIAGNOSIS — M5442 Lumbago with sciatica, left side: Secondary | ICD-10-CM | POA: Diagnosis not present

## 2020-08-06 DIAGNOSIS — E1140 Type 2 diabetes mellitus with diabetic neuropathy, unspecified: Secondary | ICD-10-CM | POA: Diagnosis not present

## 2020-08-06 DIAGNOSIS — I10 Essential (primary) hypertension: Secondary | ICD-10-CM | POA: Diagnosis not present

## 2020-08-06 DIAGNOSIS — E7800 Pure hypercholesterolemia, unspecified: Secondary | ICD-10-CM | POA: Diagnosis not present

## 2020-08-06 DIAGNOSIS — E559 Vitamin D deficiency, unspecified: Secondary | ICD-10-CM | POA: Diagnosis not present

## 2020-10-01 DIAGNOSIS — Z1231 Encounter for screening mammogram for malignant neoplasm of breast: Secondary | ICD-10-CM | POA: Diagnosis not present

## 2020-10-02 DIAGNOSIS — F410 Panic disorder [episodic paroxysmal anxiety] without agoraphobia: Secondary | ICD-10-CM | POA: Diagnosis not present

## 2020-10-02 DIAGNOSIS — K5900 Constipation, unspecified: Secondary | ICD-10-CM | POA: Diagnosis not present

## 2020-10-02 DIAGNOSIS — E7800 Pure hypercholesterolemia, unspecified: Secondary | ICD-10-CM | POA: Diagnosis not present

## 2020-10-02 DIAGNOSIS — K5730 Diverticulosis of large intestine without perforation or abscess without bleeding: Secondary | ICD-10-CM | POA: Diagnosis not present

## 2020-10-02 DIAGNOSIS — F419 Anxiety disorder, unspecified: Secondary | ICD-10-CM | POA: Diagnosis not present

## 2020-10-02 DIAGNOSIS — M5442 Lumbago with sciatica, left side: Secondary | ICD-10-CM | POA: Diagnosis not present

## 2020-10-02 DIAGNOSIS — I10 Essential (primary) hypertension: Secondary | ICD-10-CM | POA: Diagnosis not present

## 2020-10-02 DIAGNOSIS — K219 Gastro-esophageal reflux disease without esophagitis: Secondary | ICD-10-CM | POA: Diagnosis not present

## 2020-10-02 DIAGNOSIS — E1140 Type 2 diabetes mellitus with diabetic neuropathy, unspecified: Secondary | ICD-10-CM | POA: Diagnosis not present

## 2020-11-08 ENCOUNTER — Ambulatory Visit: Payer: Medicare PPO | Attending: Internal Medicine

## 2020-11-08 DIAGNOSIS — Z23 Encounter for immunization: Secondary | ICD-10-CM

## 2020-11-08 NOTE — Progress Notes (Signed)
   Covid-19 Vaccination Clinic  Name:  Paula Dunn    MRN: [id number] DOB: 12-May-1951  11/08/2020  [name] was observed post Covid-19 immunization for 15 minutes without incident. She was provided with Vaccine Information Sheet and instruction to access the V-Safe system.   [name] was instructed to call 911 with any severe reactions post vaccine: Marland Kitchen Difficulty breathing  . Swelling of face and throat  . A fast heartbeat  . A bad rash all over body  . Dizziness and weakness   Immunizations Administered    Name Date Dose VIS Date Route   Pfizer COVID-19 Vaccine 11/08/2020  4:56 PM 0.3 mL 09/11/2020 Intramuscular   Manufacturer: [employer], [employer]   Lot: [id number]   NDC: [address]-3

## 2020-11-26 DIAGNOSIS — Z23 Encounter for immunization: Secondary | ICD-10-CM | POA: Diagnosis not present

## 2021-01-17 DIAGNOSIS — H04123 Dry eye syndrome of bilateral lacrimal glands: Secondary | ICD-10-CM | POA: Diagnosis not present

## 2021-01-17 DIAGNOSIS — E119 Type 2 diabetes mellitus without complications: Secondary | ICD-10-CM | POA: Diagnosis not present

## 2021-01-17 DIAGNOSIS — H40023 Open angle with borderline findings, high risk, bilateral: Secondary | ICD-10-CM | POA: Diagnosis not present

## 2021-01-17 DIAGNOSIS — H2513 Age-related nuclear cataract, bilateral: Secondary | ICD-10-CM | POA: Diagnosis not present

## 2021-01-17 DIAGNOSIS — H524 Presbyopia: Secondary | ICD-10-CM | POA: Diagnosis not present

## 2021-04-17 DIAGNOSIS — E7800 Pure hypercholesterolemia, unspecified: Secondary | ICD-10-CM | POA: Diagnosis not present

## 2021-04-17 DIAGNOSIS — E559 Vitamin D deficiency, unspecified: Secondary | ICD-10-CM | POA: Diagnosis not present

## 2021-04-17 DIAGNOSIS — F419 Anxiety disorder, unspecified: Secondary | ICD-10-CM | POA: Diagnosis not present

## 2021-04-17 DIAGNOSIS — Z0000 Encounter for general adult medical examination without abnormal findings: Secondary | ICD-10-CM | POA: Diagnosis not present

## 2021-04-17 DIAGNOSIS — I10 Essential (primary) hypertension: Secondary | ICD-10-CM | POA: Diagnosis not present

## 2021-04-17 DIAGNOSIS — E119 Type 2 diabetes mellitus without complications: Secondary | ICD-10-CM | POA: Diagnosis not present

## 2021-04-24 DIAGNOSIS — F419 Anxiety disorder, unspecified: Secondary | ICD-10-CM | POA: Diagnosis not present

## 2021-04-24 DIAGNOSIS — I10 Essential (primary) hypertension: Secondary | ICD-10-CM | POA: Diagnosis not present

## 2021-04-24 DIAGNOSIS — L309 Dermatitis, unspecified: Secondary | ICD-10-CM | POA: Diagnosis not present

## 2021-04-24 DIAGNOSIS — K219 Gastro-esophageal reflux disease without esophagitis: Secondary | ICD-10-CM | POA: Diagnosis not present

## 2021-04-24 DIAGNOSIS — K5900 Constipation, unspecified: Secondary | ICD-10-CM | POA: Diagnosis not present

## 2021-04-24 DIAGNOSIS — E7800 Pure hypercholesterolemia, unspecified: Secondary | ICD-10-CM | POA: Diagnosis not present

## 2021-04-24 DIAGNOSIS — Z23 Encounter for immunization: Secondary | ICD-10-CM | POA: Diagnosis not present

## 2021-04-24 DIAGNOSIS — Z0000 Encounter for general adult medical examination without abnormal findings: Secondary | ICD-10-CM | POA: Diagnosis not present

## 2021-04-24 DIAGNOSIS — E1140 Type 2 diabetes mellitus with diabetic neuropathy, unspecified: Secondary | ICD-10-CM | POA: Diagnosis not present

## 2021-05-20 DIAGNOSIS — L578 Other skin changes due to chronic exposure to nonionizing radiation: Secondary | ICD-10-CM | POA: Diagnosis not present

## 2021-07-02 DIAGNOSIS — I10 Essential (primary) hypertension: Secondary | ICD-10-CM | POA: Diagnosis not present

## 2021-07-02 DIAGNOSIS — R55 Syncope and collapse: Secondary | ICD-10-CM | POA: Diagnosis not present

## 2021-07-02 DIAGNOSIS — F410 Panic disorder [episodic paroxysmal anxiety] without agoraphobia: Secondary | ICD-10-CM | POA: Diagnosis not present

## 2021-07-02 DIAGNOSIS — F419 Anxiety disorder, unspecified: Secondary | ICD-10-CM | POA: Diagnosis not present

## 2021-07-14 DIAGNOSIS — I10 Essential (primary) hypertension: Secondary | ICD-10-CM | POA: Diagnosis not present

## 2021-07-14 DIAGNOSIS — R42 Dizziness and giddiness: Secondary | ICD-10-CM | POA: Diagnosis not present

## 2021-07-14 DIAGNOSIS — H6121 Impacted cerumen, right ear: Secondary | ICD-10-CM | POA: Diagnosis not present

## 2021-07-14 DIAGNOSIS — F419 Anxiety disorder, unspecified: Secondary | ICD-10-CM | POA: Diagnosis not present

## 2021-07-17 DIAGNOSIS — R55 Syncope and collapse: Secondary | ICD-10-CM | POA: Diagnosis not present

## 2021-07-17 DIAGNOSIS — I6523 Occlusion and stenosis of bilateral carotid arteries: Secondary | ICD-10-CM | POA: Diagnosis not present

## 2021-07-17 DIAGNOSIS — R600 Localized edema: Secondary | ICD-10-CM | POA: Diagnosis not present

## 2021-07-29 DIAGNOSIS — E559 Vitamin D deficiency, unspecified: Secondary | ICD-10-CM | POA: Diagnosis not present

## 2021-07-29 DIAGNOSIS — E1140 Type 2 diabetes mellitus with diabetic neuropathy, unspecified: Secondary | ICD-10-CM | POA: Diagnosis not present

## 2021-07-29 DIAGNOSIS — E7800 Pure hypercholesterolemia, unspecified: Secondary | ICD-10-CM | POA: Diagnosis not present

## 2021-08-04 DIAGNOSIS — R42 Dizziness and giddiness: Secondary | ICD-10-CM | POA: Diagnosis not present

## 2021-08-04 DIAGNOSIS — F418 Other specified anxiety disorders: Secondary | ICD-10-CM | POA: Diagnosis not present

## 2021-08-04 DIAGNOSIS — F410 Panic disorder [episodic paroxysmal anxiety] without agoraphobia: Secondary | ICD-10-CM | POA: Diagnosis not present

## 2021-08-21 DIAGNOSIS — I10 Essential (primary) hypertension: Secondary | ICD-10-CM | POA: Diagnosis not present

## 2021-08-21 DIAGNOSIS — F418 Other specified anxiety disorders: Secondary | ICD-10-CM | POA: Diagnosis not present

## 2021-08-21 DIAGNOSIS — F410 Panic disorder [episodic paroxysmal anxiety] without agoraphobia: Secondary | ICD-10-CM | POA: Diagnosis not present

## 2021-09-03 DIAGNOSIS — I10 Essential (primary) hypertension: Secondary | ICD-10-CM | POA: Diagnosis not present

## 2021-09-03 DIAGNOSIS — F3341 Major depressive disorder, recurrent, in partial remission: Secondary | ICD-10-CM | POA: Diagnosis not present

## 2021-09-03 DIAGNOSIS — Z1389 Encounter for screening for other disorder: Secondary | ICD-10-CM | POA: Diagnosis not present

## 2021-09-03 DIAGNOSIS — E1142 Type 2 diabetes mellitus with diabetic polyneuropathy: Secondary | ICD-10-CM | POA: Diagnosis not present

## 2021-09-03 DIAGNOSIS — Z23 Encounter for immunization: Secondary | ICD-10-CM | POA: Diagnosis not present

## 2021-09-03 DIAGNOSIS — K581 Irritable bowel syndrome with constipation: Secondary | ICD-10-CM | POA: Diagnosis not present

## 2021-09-03 DIAGNOSIS — F411 Generalized anxiety disorder: Secondary | ICD-10-CM | POA: Diagnosis not present

## 2021-09-03 DIAGNOSIS — E669 Obesity, unspecified: Secondary | ICD-10-CM | POA: Diagnosis not present

## 2021-09-22 DIAGNOSIS — E7800 Pure hypercholesterolemia, unspecified: Secondary | ICD-10-CM | POA: Diagnosis not present

## 2021-09-22 DIAGNOSIS — I10 Essential (primary) hypertension: Secondary | ICD-10-CM | POA: Diagnosis not present

## 2021-09-22 DIAGNOSIS — F419 Anxiety disorder, unspecified: Secondary | ICD-10-CM | POA: Diagnosis not present

## 2021-09-22 DIAGNOSIS — E1140 Type 2 diabetes mellitus with diabetic neuropathy, unspecified: Secondary | ICD-10-CM | POA: Diagnosis not present

## 2021-09-24 DIAGNOSIS — H04123 Dry eye syndrome of bilateral lacrimal glands: Secondary | ICD-10-CM | POA: Diagnosis not present

## 2021-10-02 DIAGNOSIS — E1142 Type 2 diabetes mellitus with diabetic polyneuropathy: Secondary | ICD-10-CM | POA: Diagnosis not present

## 2021-10-02 DIAGNOSIS — E669 Obesity, unspecified: Secondary | ICD-10-CM | POA: Diagnosis not present

## 2021-10-02 DIAGNOSIS — F411 Generalized anxiety disorder: Secondary | ICD-10-CM | POA: Diagnosis not present

## 2021-10-02 DIAGNOSIS — I10 Essential (primary) hypertension: Secondary | ICD-10-CM | POA: Diagnosis not present

## 2021-10-02 DIAGNOSIS — F3341 Major depressive disorder, recurrent, in partial remission: Secondary | ICD-10-CM | POA: Diagnosis not present

## 2021-10-02 DIAGNOSIS — K581 Irritable bowel syndrome with constipation: Secondary | ICD-10-CM | POA: Diagnosis not present

## 2021-10-02 DIAGNOSIS — E7800 Pure hypercholesterolemia, unspecified: Secondary | ICD-10-CM | POA: Diagnosis not present

## 2021-10-06 DIAGNOSIS — Z1231 Encounter for screening mammogram for malignant neoplasm of breast: Secondary | ICD-10-CM | POA: Diagnosis not present

## 2021-10-07 ENCOUNTER — Encounter: Payer: Self-pay | Admitting: Cardiovascular Disease

## 2021-10-07 ENCOUNTER — Ambulatory Visit: Payer: Medicare PPO | Admitting: Cardiovascular Disease

## 2021-10-07 ENCOUNTER — Other Ambulatory Visit: Payer: Self-pay

## 2021-10-07 DIAGNOSIS — E782 Mixed hyperlipidemia: Secondary | ICD-10-CM | POA: Diagnosis not present

## 2021-10-07 DIAGNOSIS — E785 Hyperlipidemia, unspecified: Secondary | ICD-10-CM | POA: Insufficient documentation

## 2021-10-07 DIAGNOSIS — I10 Essential (primary) hypertension: Secondary | ICD-10-CM

## 2021-10-07 NOTE — Patient Instructions (Addendum)

## 2021-10-07 NOTE — Assessment & Plan Note (Signed)
Paula Dunn was referred to me by for essential hypertension.  This began several months ago.  She is on valsartan.  She takes her blood pressure daily and keep a log which I reviewed and her blood pressures are under good control.  She is aware of salt avoidance.

## 2021-10-07 NOTE — Assessment & Plan Note (Signed)
History of hyperlipidemia on pravastatin with lipid profile performed 04/17/2021 revealing total cholesterol of 156, LDL of 102 and HDL of 43, followed by her PCP.

## 2021-10-07 NOTE — Progress Notes (Signed)
10/07/2021 Paula Dunn   [birth year]  [id number]  Primary Physician Thana Ates, MD Primary Cardiologist: Runell Gess MD Nicholes Calam[place], [place]  HPI:  Paula Dunn is a 70 y.o. female moderately overweight widowed African-American female mother of 2 children unfortunately both of whom are deceased with no grandchildren referred by her PCP for essential hypertension.  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  Her cardiovascular risk factor profile is notable for treated hypertension and hyperlipidemia.  She smoked remotely.  There is no family history of heart disease.  She is never had heart attack or stroke.  She denies chest pain or shortness of breath.  She was diagnosed with hypertension several months ago and was begun on valsartan.  She checks her blood pressure daily which has been under good control on 1 antihypertensive medication.  She is aware of salt avoidance.   Current Meds  Medication Sig   Cholecalciferol (VITAMIN D) 2000 UNITS CAPS Take 1 capsule by mouth daily.   CINNAMON PO Take 1,000 mg by mouth daily.   escitalopram (LEXAPRO) 10 MG tablet Take 10 mg by mouth daily.   Multiple Vitamin (MULTI VITAMIN) TABS daily.   omeprazole (PRILOSEC) 20 MG capsule Take 20 mg by mouth daily.   pravastatin (PRAVACHOL) 40 MG tablet Take 40 mg by mouth daily.   triamcinolone cream (KENALOG) 0.1 % Apply topically as needed.   valsartan (DIOVAN) 80 MG tablet Take 80 mg by mouth daily.     No Known Allergies  Social History   Socioeconomic History   Marital status: Married    Spouse name: Not on file   Number of children: 2   Years of education: college     Highest education level: Not on file  Occupational History   Occupation: Banker: [employer]  Tobacco Use   Smoking status: Former    Packs/day: 0.50    Years: 20.00    Pack years: 10.00     Types: Cigarettes   Smokeless tobacco: Never   Tobacco comments:    quit 15 years  Substance and Sexual Activity   Alcohol use: Yes    Comment: OCCASIONAL   Drug use: No   Sexual activity: Not on file  Other Topics Concern   Not on file  Social History Narrative   Not on file   Social Determinants of Health   Financial Resource Strain: Not on file  Food Insecurity: Not on file  Transportation Needs: Not on file  Physical Activity: Not on file  Stress: Not on file  Social Connections: Not on file  Intimate Partner Violence: Not on file     Review of Systems: General: negative for chills, fever, night sweats or weight changes.  Cardiovascular: negative for chest pain, dyspnea on exertion, edema, orthopnea, palpitations, paroxysmal nocturnal dyspnea or shortness of breath Dermatological: negative for rash Respiratory: negative for cough or wheezing Urologic: negative for hematuria Abdominal: negative for nausea, vomiting, diarrhea, bright red blood per rectum, melena, or hematemesis Neurologic: negative for visual changes, syncope, or dizziness All other systems reviewed and are otherwise negative except as noted above.    Blood pressure (!) 165/80, pulse 76, height 5\' 6"  (1.676 m), weight 214 lb 12.8 oz (97.4 kg), SpO2 98 %.  General appearance: alert and no distress Neck: no adenopathy, no carotid bruit, no JVD, supple,  symmetrical, trachea midline, and thyroid not enlarged, symmetric, no tenderness/mass/nodules Lungs: clear to auscultation bilaterally Heart: regular rate and rhythm, S1, S2 normal, no murmur, click, rub or gallop Extremities: extremities normal, atraumatic, no cyanosis or edema Pulses: 2+ and symmetric Skin: Skin color, texture, turgor normal. No rashes or lesions Neurologic: Grossly normal  EKG sinus rhythm at 76 without ST or T wave changes.  I personally reviewed this EKG.  ASSESSMENT AND PLAN:   Essential hypertension Ms. Stinnett was referred to  me by for essential hypertension.  This began several months ago.  She is on valsartan.  She takes her blood pressure daily and keep a log which I reviewed and her blood pressures are under good control.  She is aware of salt avoidance.  Hyperlipidemia History of hyperlipidemia on pravastatin with lipid profile performed 04/17/2021 revealing total cholesterol of 156, LDL of 102 and HDL of 43, followed by her PCP.     Lorretta Harp MD FACP,FACC,FAHA, [facility] 10/07/2021 2:51 PM

## 2021-11-18 DIAGNOSIS — Z6834 Body mass index (BMI) 34.0-34.9, adult: Secondary | ICD-10-CM | POA: Diagnosis not present

## 2021-11-18 DIAGNOSIS — F419 Anxiety disorder, unspecified: Secondary | ICD-10-CM | POA: Diagnosis not present

## 2021-11-18 DIAGNOSIS — Z7722 Contact with and (suspected) exposure to environmental tobacco smoke (acute) (chronic): Secondary | ICD-10-CM | POA: Diagnosis not present

## 2021-11-18 DIAGNOSIS — K219 Gastro-esophageal reflux disease without esophagitis: Secondary | ICD-10-CM | POA: Diagnosis not present

## 2021-11-18 DIAGNOSIS — E119 Type 2 diabetes mellitus without complications: Secondary | ICD-10-CM | POA: Diagnosis not present

## 2021-11-18 DIAGNOSIS — E785 Hyperlipidemia, unspecified: Secondary | ICD-10-CM | POA: Diagnosis not present

## 2021-11-18 DIAGNOSIS — I10 Essential (primary) hypertension: Secondary | ICD-10-CM | POA: Diagnosis not present

## 2021-11-18 DIAGNOSIS — Z803 Family history of malignant neoplasm of breast: Secondary | ICD-10-CM | POA: Diagnosis not present

## 2021-11-18 DIAGNOSIS — E669 Obesity, unspecified: Secondary | ICD-10-CM | POA: Diagnosis not present

## 2021-11-21 DIAGNOSIS — I10 Essential (primary) hypertension: Secondary | ICD-10-CM | POA: Diagnosis not present

## 2021-11-21 DIAGNOSIS — E7800 Pure hypercholesterolemia, unspecified: Secondary | ICD-10-CM | POA: Diagnosis not present

## 2021-11-21 DIAGNOSIS — F419 Anxiety disorder, unspecified: Secondary | ICD-10-CM | POA: Diagnosis not present

## 2021-11-21 DIAGNOSIS — E1140 Type 2 diabetes mellitus with diabetic neuropathy, unspecified: Secondary | ICD-10-CM | POA: Diagnosis not present

## 2021-11-27 DIAGNOSIS — E7800 Pure hypercholesterolemia, unspecified: Secondary | ICD-10-CM | POA: Diagnosis not present

## 2021-11-27 DIAGNOSIS — F3341 Major depressive disorder, recurrent, in partial remission: Secondary | ICD-10-CM | POA: Diagnosis not present

## 2021-11-27 DIAGNOSIS — I10 Essential (primary) hypertension: Secondary | ICD-10-CM | POA: Diagnosis not present

## 2021-11-27 DIAGNOSIS — E1142 Type 2 diabetes mellitus with diabetic polyneuropathy: Secondary | ICD-10-CM | POA: Diagnosis not present

## 2021-12-05 ENCOUNTER — Ambulatory Visit: Payer: Medicare PPO | Admitting: Podiatry

## 2021-12-18 ENCOUNTER — Other Ambulatory Visit: Payer: Self-pay

## 2021-12-18 ENCOUNTER — Encounter: Payer: Self-pay | Admitting: Podiatry

## 2021-12-18 ENCOUNTER — Ambulatory Visit: Payer: Medicare PPO | Admitting: Podiatry

## 2021-12-18 DIAGNOSIS — M2041 Other hammer toe(s) (acquired), right foot: Secondary | ICD-10-CM

## 2021-12-18 DIAGNOSIS — L600 Ingrowing nail: Secondary | ICD-10-CM

## 2021-12-23 ENCOUNTER — Encounter: Payer: Self-pay | Admitting: Podiatry

## 2021-12-23 DIAGNOSIS — F419 Anxiety disorder, unspecified: Secondary | ICD-10-CM | POA: Diagnosis not present

## 2021-12-23 DIAGNOSIS — E1140 Type 2 diabetes mellitus with diabetic neuropathy, unspecified: Secondary | ICD-10-CM | POA: Diagnosis not present

## 2021-12-23 DIAGNOSIS — I10 Essential (primary) hypertension: Secondary | ICD-10-CM | POA: Diagnosis not present

## 2021-12-23 DIAGNOSIS — E7800 Pure hypercholesterolemia, unspecified: Secondary | ICD-10-CM | POA: Diagnosis not present

## 2021-12-23 NOTE — Progress Notes (Signed)
Subjective:  Patient ID: Paula Dunn, female    DOB: 1951-04-05,  MRN: [id number]  Chief Complaint  Patient presents with   Toe Pain    Pt is here due to left 5 toe pain and also 3rd toe on right foot pain    71 y.o. female presents with the above complaint.  Patient presents with primary complaint of right third lateral border ingrown.  Patient states is painful to touch painful to walk on.  She would like to have it removed.  She has not seen anyone else prior to seeing me.  She also has secondary complaint of right fifth digit hammertoe contracture.  She would like to discuss treatment options for this.  She is known to Dr. Adah Perl for routine foot care.  She wanted to know if there is any nonsurgical options for her fifth toe hammertoe contracture.  She states it rubs on it causes her pain.  Hurts with ambulation.  Pain scale 7 out of 10.  She is a diabetic with last A1c of that is controlled.   Review of Systems: Negative except as noted in the HPI. Denies N/V/F/Ch.  Past Medical History:  Diagnosis Date   Diabetes mellitus    Diverticulosis 12/2003   Headache(784.0) 01/02/2014   Heel spur    History of hematuria    History of UTI    Hyperlipidemia    Hypertension    Numbness of feet 10/15/2014   Obesity    Osteopenia     Current Outpatient Medications:    aspirin 81 MG tablet, Take 81 mg by mouth daily. (Patient not taking: Reported on 10/07/2021), Disp: , Rfl:    bisoprolol (ZEBETA) 5 MG tablet, Take 5 mg by mouth daily. (Patient not taking: Reported on 10/07/2021), Disp: , Rfl:    Cholecalciferol (VITAMIN D) 2000 UNITS CAPS, Take 1 capsule by mouth daily., Disp: , Rfl:    CINNAMON PO, Take 1,000 mg by mouth daily., Disp: , Rfl:    escitalopram (LEXAPRO) 10 MG tablet, Take 10 mg by mouth daily., Disp: , Rfl:    loratadine (CLARITIN) 10 MG tablet, Take 10 mg by mouth daily as needed for allergies. (Patient not taking: Reported on 10/07/2021), Disp: , Rfl:    LORazepam  (ATIVAN) 0.5 MG tablet, Take 0.5 mg by mouth every 8 (eight) hours as needed for anxiety (FOR ANXIETY). (Patient not taking: Reported on 10/07/2021), Disp: , Rfl:    Multiple Vitamin (MULTI VITAMIN) TABS, daily., Disp: , Rfl:    omeprazole (PRILOSEC) 20 MG capsule, Take 20 mg by mouth daily., Disp: , Rfl:    pravastatin (PRAVACHOL) 40 MG tablet, Take 40 mg by mouth daily., Disp: , Rfl:    triamcinolone cream (KENALOG) 0.1 %, Apply topically as needed., Disp: , Rfl:    valsartan (DIOVAN) 80 MG tablet, Take 80 mg by mouth daily., Disp: , Rfl:   Social History   Tobacco Use  Smoking Status Former   Packs/day: 0.50   Years: 20.00   Pack years: 10.00   Types: Cigarettes  Smokeless Tobacco Never  Tobacco Comments   quit 15 years    No Known Allergies Objective:  There were no vitals filed for this visit. There is no height or weight on file to calculate BMI. Constitutional Well developed. Well nourished.  Vascular Dorsalis pedis pulses palpable bilaterally. Posterior tibial pulses palpable bilaterally. Capillary refill normal to all digits.  No cyanosis or clubbing noted. Pedal hair growth normal.  Neurologic Normal speech. Oriented  to person, place, and time. Epicritic sensation to light touch grossly present bilaterally.  Dermatologic Painful ingrowing nail at lateral nail borders of the third digit nail right. No other open wounds. No skin lesions.  Orthopedic: Normal joint ROM without pain or crepitus bilaterally. Right third digit hammertoe contracture noted semiflexible in nature with underlying adductovarus deformity No bony tenderness.   Radiographs: None Assessment:   1. Hammertoe of right foot   2. Ingrown nail of third toe of right foot    Plan:  Patient was evaluated and treated and all questions answered.  Right fifth digit hammertoe contracture -I explained to the patient the etiology of hammertoe contracture and various treatment options were extensively  discussed.  I discussed with her given the nature of the hammertoe contracture she will benefit from surgical intervention however for now continue with conservative treatment options I discussed shoe gear modification with her in extensive detail -Toe protectors were dispensed to give her relief when ambulating.  Ingrown Nail, right -Patient elects to proceed with minor surgery to remove ingrown toenail removal today. Consent reviewed and signed by patient. -Ingrown nail excised. See procedure note. -Educated on post-procedure care including soaking. Written instructions provided and reviewed. -Patient to follow up in 2 weeks for nail check.  Procedure: Excision of Ingrown Toenail Location: Right 3rd toe lateral nail borders. Anesthesia: Lidocaine 1% plain; 1.5 mL and Marcaine 0.5% plain; 1.5 mL, digital block. Skin Prep: Betadine. Dressing: Silvadene; telfa; dry, sterile, compression dressing. Technique: Following skin prep, the toe was exsanguinated and a tourniquet was secured at the base of the toe. The affected nail border was freed, split with a nail splitter, and excised. Chemical matrixectomy was then performed with phenol and irrigated out with alcohol. The tourniquet was then removed and sterile dressing applied. Disposition: Patient tolerated procedure well. Patient to return in 2 weeks for follow-up.   Return in about 3 months (around 03/18/2022) for [facility] .

## 2022-01-05 DIAGNOSIS — R718 Other abnormality of red blood cells: Secondary | ICD-10-CM | POA: Diagnosis not present

## 2022-01-05 DIAGNOSIS — R5382 Chronic fatigue, unspecified: Secondary | ICD-10-CM | POA: Diagnosis not present

## 2022-01-05 DIAGNOSIS — I10 Essential (primary) hypertension: Secondary | ICD-10-CM | POA: Diagnosis not present

## 2022-01-05 DIAGNOSIS — R0609 Other forms of dyspnea: Secondary | ICD-10-CM | POA: Diagnosis not present

## 2022-01-05 DIAGNOSIS — E1142 Type 2 diabetes mellitus with diabetic polyneuropathy: Secondary | ICD-10-CM | POA: Diagnosis not present

## 2022-01-05 DIAGNOSIS — F3341 Major depressive disorder, recurrent, in partial remission: Secondary | ICD-10-CM | POA: Diagnosis not present

## 2022-01-20 DIAGNOSIS — F419 Anxiety disorder, unspecified: Secondary | ICD-10-CM | POA: Diagnosis not present

## 2022-01-20 DIAGNOSIS — E7800 Pure hypercholesterolemia, unspecified: Secondary | ICD-10-CM | POA: Diagnosis not present

## 2022-01-20 DIAGNOSIS — E1140 Type 2 diabetes mellitus with diabetic neuropathy, unspecified: Secondary | ICD-10-CM | POA: Diagnosis not present

## 2022-01-20 DIAGNOSIS — I10 Essential (primary) hypertension: Secondary | ICD-10-CM | POA: Diagnosis not present

## 2022-01-23 DIAGNOSIS — H35033 Hypertensive retinopathy, bilateral: Secondary | ICD-10-CM | POA: Diagnosis not present

## 2022-01-23 DIAGNOSIS — H04123 Dry eye syndrome of bilateral lacrimal glands: Secondary | ICD-10-CM | POA: Diagnosis not present

## 2022-01-23 DIAGNOSIS — H25813 Combined forms of age-related cataract, bilateral: Secondary | ICD-10-CM | POA: Diagnosis not present

## 2022-01-23 DIAGNOSIS — H40023 Open angle with borderline findings, high risk, bilateral: Secondary | ICD-10-CM | POA: Diagnosis not present

## 2022-02-03 DIAGNOSIS — F5101 Primary insomnia: Secondary | ICD-10-CM | POA: Diagnosis not present

## 2022-02-03 DIAGNOSIS — R519 Headache, unspecified: Secondary | ICD-10-CM | POA: Diagnosis not present

## 2022-02-03 DIAGNOSIS — E7800 Pure hypercholesterolemia, unspecified: Secondary | ICD-10-CM | POA: Diagnosis not present

## 2022-02-03 DIAGNOSIS — I10 Essential (primary) hypertension: Secondary | ICD-10-CM | POA: Diagnosis not present

## 2022-02-23 DIAGNOSIS — H60543 Acute eczematoid otitis externa, bilateral: Secondary | ICD-10-CM | POA: Diagnosis not present

## 2022-02-23 DIAGNOSIS — F411 Generalized anxiety disorder: Secondary | ICD-10-CM | POA: Diagnosis not present

## 2022-02-23 DIAGNOSIS — R42 Dizziness and giddiness: Secondary | ICD-10-CM | POA: Diagnosis not present

## 2022-02-23 DIAGNOSIS — I10 Essential (primary) hypertension: Secondary | ICD-10-CM | POA: Diagnosis not present

## 2022-02-23 DIAGNOSIS — R700 Elevated erythrocyte sedimentation rate: Secondary | ICD-10-CM | POA: Diagnosis not present

## 2022-03-18 ENCOUNTER — Ambulatory Visit: Payer: Medicare PPO | Admitting: Podiatry

## 2022-03-25 ENCOUNTER — Ambulatory Visit (INDEPENDENT_AMBULATORY_CARE_PROVIDER_SITE_OTHER): Payer: Medicare PPO

## 2022-03-25 ENCOUNTER — Ambulatory Visit: Payer: Medicare PPO | Admitting: Podiatry

## 2022-03-25 DIAGNOSIS — H60543 Acute eczematoid otitis externa, bilateral: Secondary | ICD-10-CM | POA: Diagnosis not present

## 2022-03-25 DIAGNOSIS — Z01818 Encounter for other preprocedural examination: Secondary | ICD-10-CM | POA: Diagnosis not present

## 2022-03-25 DIAGNOSIS — R42 Dizziness and giddiness: Secondary | ICD-10-CM | POA: Diagnosis not present

## 2022-03-25 DIAGNOSIS — M2042 Other hammer toe(s) (acquired), left foot: Secondary | ICD-10-CM | POA: Diagnosis not present

## 2022-03-25 DIAGNOSIS — G44229 Chronic tension-type headache, not intractable: Secondary | ICD-10-CM | POA: Diagnosis not present

## 2022-03-25 DIAGNOSIS — M2041 Other hammer toe(s) (acquired), right foot: Secondary | ICD-10-CM

## 2022-03-25 DIAGNOSIS — I10 Essential (primary) hypertension: Secondary | ICD-10-CM | POA: Diagnosis not present

## 2022-03-25 DIAGNOSIS — E1142 Type 2 diabetes mellitus with diabetic polyneuropathy: Secondary | ICD-10-CM | POA: Diagnosis not present

## 2022-03-31 ENCOUNTER — Telehealth: Payer: Self-pay | Admitting: Urology

## 2022-03-31 NOTE — Progress Notes (Signed)
?Subjective:  ?Patient ID: Paula Dunn, female    DOB: 30-Apr-1951,  MRN: [id number] ? ?No chief complaint on file. ? ? ?71 y.o. female presents with the above complaint.  Patient presents with complaint left fifth digit hammertoe contracture and painful.  Patient has a history of surgery done to the fifth digit where they did an arthroplasty.  She would like to discuss surgical options if possible.  It causes her a lot of pain with ambulating.  There is palpable area on the lateral side of the fifth toe that rubs against her shoes are causing her..  She has not seen anyone else prior to seeing me.  She would like to discuss surgical options at this time.  She has failed all conservative treatment options including shoe gear modification, offloading, padding protecting.  Pain scale is 8 out of 10 hurts with ambulation ? ? ?Review of Systems: Negative except as noted in the HPI. Denies N/V/F/Ch. ? ?Past Medical History:  ?Diagnosis Date  ? Diabetes mellitus   ? Diverticulosis 12/2003  ? Headache(784.0) 01/02/2014  ? Heel spur   ? History of hematuria   ? History of UTI   ? Hyperlipidemia   ? Hypertension   ? Numbness of feet 10/15/2014  ? Obesity   ? Osteopenia   ? ? ?Current Outpatient Medications:  ?  aspirin 81 MG tablet, Take 81 mg by mouth daily. (Patient not taking: Reported on 10/07/2021), Disp: , Rfl:  ?  bisoprolol (ZEBETA) 5 MG tablet, Take 5 mg by mouth daily. (Patient not taking: Reported on 10/07/2021), Disp: , Rfl:  ?  Cholecalciferol (VITAMIN D) 2000 UNITS CAPS, Take 1 capsule by mouth daily., Disp: , Rfl:  ?  CINNAMON PO, Take 1,000 mg by mouth daily., Disp: , Rfl:  ?  escitalopram (LEXAPRO) 10 MG tablet, Take 10 mg by mouth daily., Disp: , Rfl:  ?  loratadine (CLARITIN) 10 MG tablet, Take 10 mg by mouth daily as needed for allergies. (Patient not taking: Reported on 10/07/2021), Disp: , Rfl:  ?  LORazepam (ATIVAN) 0.5 MG tablet, Take 0.5 mg by mouth every 8 (eight) hours as needed for anxiety (FOR  ANXIETY). (Patient not taking: Reported on 10/07/2021), Disp: , Rfl:  ?  Multiple Vitamin (MULTI VITAMIN) TABS, daily., Disp: , Rfl:  ?  omeprazole (PRILOSEC) 20 MG capsule, Take 20 mg by mouth daily., Disp: , Rfl:  ?  pravastatin (PRAVACHOL) 40 MG tablet, Take 40 mg by mouth daily., Disp: , Rfl:  ?  triamcinolone cream (KENALOG) 0.1 %, Apply topically as needed., Disp: , Rfl:  ?  valsartan (DIOVAN) 80 MG tablet, Take 80 mg by mouth daily., Disp: , Rfl:  ? ?Social History  ? ?Tobacco Use  ?Smoking Status Former  ? Packs/day: 0.50  ? Years: 20.00  ? Pack years: 10.00  ? Types: Cigarettes  ?Smokeless Tobacco Never  ?Tobacco Comments  ? quit 15 years  ? ? ?No Known Allergies ?Objective:  ?There were no vitals filed for this visit. ?There is no height or weight on file to calculate BMI. ?Constitutional Well developed. ?Well nourished.  ?Vascular Dorsalis pedis pulses palpable bilaterally. ?Posterior tibial pulses palpable bilaterally. ?Capillary refill normal to all digits.  ?No cyanosis or clubbing noted. ?Pedal hair growth normal.  ?Neurologic Normal speech. ?Oriented to person, place, and time. ?Epicritic sensation to light touch grossly present bilaterally.  ?Dermatologic Nails well groomed and normal in appearance. ?No open wounds. ?No skin lesions.  ?Orthopedic: Left fifth digit  hammertoe contracture noted with recurrence.  Palpable area to the lateral aspect of the fifth digit noted consistent with hyperkeratotic lesion/callus formation due to excessive rubbing and pressure.  This is a flexible deformity.  ? ?Radiographs: 3 views of schedule mature adult left foot: Previous arthroplasty noted of the fifth digit.  Arthritic changes noted to the first metatarsophalangeal joint may also be extra-articular in nature consistent with gout.  Plantar and posterior heel spurring noted severe in nature midfoot arthritis noted. ?Assessment:  ? ?1. Hammertoe of left foot   ?2. Encounter for preoperative examination for  general surgical procedure   ? ?Plan:  ?Patient was evaluated and treated and all questions answered. ? ?Left fifth digit hammertoe contracture semiflexible ?-All questions and concerns were discussed with the patient in extensive detail.  Given the amount of pain that she is experiencing in the setting of no relief I believe she will benefit from hammertoe surgery to the left fifth digit.  I discussed this with the patient in extensive detail she states understanding and would like to proceed with surgery given that she has failed all conservative treatment options at this time. ?-She will benefit from revision of fifth digit hammertoe arthroplasty.  This will be an office procedure.  She will be weightbearing as tolerated surgical shoe after the surgery ?-Informed surgical risk consent was reviewed and read aloud to the patient.  I reviewed the films.  I have discussed my findings with the patient in great detail.  I have discussed all risks including but not limited to infection, stiffness, scarring, limp, disability, deformity, damage to blood vessels and nerves, numbness, poor healing, need for braces, arthritis, chronic pain, amputation, death.  All benefits and realistic expectations discussed in great detail.  I have made no promises as to the outcome.  I have provided realistic expectations.  I have offered the patient a 2nd opinion, which they have declined and assured me they preferred to proceed despite the risks ? ? ?No follow-ups on file.  ?

## 2022-03-31 NOTE — Telephone Encounter (Signed)
DOS - 04/22/22 ? ?HAMMERTOE REPAIR 5TH LEFT --- [phone] ? ?[employer] EFFECTIVE DATE - 11/24/19 ? ?PER [place] WEBSITE FOR CPT CODE 96295 HAS BEEN APPROVED, AUTH # [id number], GOOD FROM 04/22/22 - 07/21/22. ? Matthew Saras # [id number] ?

## 2022-04-21 DIAGNOSIS — R262 Difficulty in walking, not elsewhere classified: Secondary | ICD-10-CM | POA: Diagnosis not present

## 2022-04-21 DIAGNOSIS — M6281 Muscle weakness (generalized): Secondary | ICD-10-CM | POA: Diagnosis not present

## 2022-04-22 ENCOUNTER — Ambulatory Visit (INDEPENDENT_AMBULATORY_CARE_PROVIDER_SITE_OTHER): Payer: Medicare PPO

## 2022-04-22 ENCOUNTER — Ambulatory Visit: Payer: Medicare PPO | Admitting: Podiatry

## 2022-04-22 DIAGNOSIS — M2042 Other hammer toe(s) (acquired), left foot: Secondary | ICD-10-CM

## 2022-04-22 DIAGNOSIS — I10 Essential (primary) hypertension: Secondary | ICD-10-CM | POA: Diagnosis not present

## 2022-04-22 MED ORDER — OXYCODONE-ACETAMINOPHEN 5-325 MG PO TABS: 1.0000 | ORAL_TABLET | ORAL | 0 refills | Status: AC | PRN

## 2022-04-22 MED ORDER — IBUPROFEN 800 MG PO TABS: 800.0000 mg | ORAL_TABLET | Freq: Four times a day (QID) | ORAL | 1 refills | Status: AC | PRN

## 2022-04-22 NOTE — Progress Notes (Deleted)
Surgeon:  Assistants: *** Pre-operative diagnosis: * No surgery found *  Post-operative diagnosis: same Procedure: * No surgery found *  Pathology: * Cannot find log *  Pertinent Intra-op findings: **** Anesthesia: * No surgery found *  Hemostasis: * No surgery found * EBL: * No surgery found *  Materials: *** Injectables: *** Complications: ***  Indications for surgery: A 71 y.o. female presents with ***. Patient has failed all conservative therapy including but not limited to ***. {He/she (caps):30048} wishes to have surgical correction of the foot/deformity. It was determined that patient would benefit from ***. Informed surgical risk consent was reviewed and read aloud to the patient.  I reviewed the films.  I have discussed my findings with the patient in great detail.  I have discussed all risks including but not limited to infection, stiffness, scarring, limp, disability, deformity, damage to blood vessels and nerves, numbness, poor healing, need for braces, arthritis, chronic pain, amputation, death.  All benefits and realistic expectations discussed in great detail.  I have made no promises as to the outcome.  I have provided realistic expectations.  I have offered the patient a 2nd opinion, which they have declined and assured me they preferred to proceed despite the risks   Procedure in detail: The patient was both verbally and visually identified by myself, the nursing staff, and anesthesia staff in the preoperative holding area. They were then transferred to the operating room and placed on the operative table in {DESC; PRONE / SUPINE / LATERAL:19389} position.  At the conclusion of the procedure the patient was awoken from anesthesia and found to have tolerated the procedure well any complications. There were transferred to PACU with vital signs stable and vascular status intact.  Nicholes Rough, DPM

## 2022-04-23 NOTE — Progress Notes (Signed)
Surgeon: Dr. Nicholes Rough Assistants: None Pre-operative diagnosis: 1.  Left fifth digit hammertoe Post-operative diagnosis: same Procedure: Same Pathology: None Pertinent Intra-op findings: Left fifth digit hammertoe contracture noted at the PIPJ joint Anesthesia: 6 cc of one-to-one mixture of half percent Marcaine plain 1% lidocaine plain Hemostasis: Calf tourniquet for 15 minutes EBL: Minimal Materials: 4-0 nylon Injectables: None Complications: None  Indications for surgery: A 71 y.o. female presents with left fifth digit painful hammertoe contracture. Patient has failed all conservative therapy including but not limited to shoe gear modification padding protecting offloading. She wishes to have surgical correction of the foot/deformity. It was determined that patient would benefit from left fifth digit PIPJ arthroplasty. Informed surgical risk consent was reviewed and read aloud to the patient.  I reviewed the films.  I have discussed my findings with the patient in great detail.  I have discussed all risks including but not limited to infection, stiffness, scarring, limp, disability, deformity, damage to blood vessels and nerves, numbness, poor healing, need for braces, arthritis, chronic pain, amputation, death.  All benefits and realistic expectations discussed in great detail.  I have made no promises as to the outcome.  I have provided realistic expectations.  I have offered the patient a 2nd opinion, which they have declined and assured me they preferred to proceed despite the risks   Procedure in detail: The patient was both verbally and visually identified by myself, the nursing staff, and anesthesia staff in the preoperative holding area. They were then transferred to the operating room and placed on the operative table in supine position.  Attention was directed at the left fifth digit, elliptical incision was delineated using skin marker.  Using #15 blade incision was carried  down from epidermal dermal junction down to the level of the tendon.  Using transverse tenotomy tendon was resected and reflected back to reveal the head of the proximal phalanx.  At this time is important note that there was previous surgery that was done to this head.  Using sagittal saw the head was removed and cyst discarded in standard technique.  The wound was thoroughly irrigated.  The tendon was repaired with 4-0 Vicryl and the skin was closed with 4-0 nylon.  Good correction alignment noted of the toe.  The toe was dressed with Xeroform 4 x 4 gauze Kerlix Ace bandage.  She can be weightbearing as tolerated with surgical shoe.  At the conclusion of the procedure the patient was awoken from anesthesia and found to have tolerated the procedure well any complications. There were transferred to PACU with vital signs stable and vascular status intact.  Nicholes Rough, DPM

## 2022-04-29 ENCOUNTER — Ambulatory Visit (INDEPENDENT_AMBULATORY_CARE_PROVIDER_SITE_OTHER): Payer: Medicare PPO

## 2022-04-29 ENCOUNTER — Ambulatory Visit (INDEPENDENT_AMBULATORY_CARE_PROVIDER_SITE_OTHER): Payer: Medicare PPO | Admitting: Podiatry

## 2022-04-29 DIAGNOSIS — M2042 Other hammer toe(s) (acquired), left foot: Secondary | ICD-10-CM

## 2022-04-29 DIAGNOSIS — Z9889 Other specified postprocedural states: Secondary | ICD-10-CM

## 2022-05-05 ENCOUNTER — Encounter: Payer: Self-pay | Admitting: Podiatry

## 2022-05-05 NOTE — Progress Notes (Signed)
Subjective:  Patient ID: Paula Dunn, female    DOB: 1951/09/17,  MRN: [id number]  Chief Complaint  Patient presents with   Routine Post Op    POV #1 DOS 04/22/2022 LT 5TH HAMMERTOE CORRECTION    DOS: 04/22/2022 Procedure: Left fifth digit hammertoe arthroplasty  71 y.o. female returns for post-op check.  Patient states that she is doing okay.  She states that the pain is very minimal she is ambulating with surgical shoe.  She denies any other acute complaints.  Review of Systems: Negative except as noted in the HPI. Denies N/V/F/Ch.  Past Medical History:  Diagnosis Date   Diabetes mellitus    Diverticulosis 12/2003   Headache(784.0) 01/02/2014   Heel spur    History of hematuria    History of UTI    Hyperlipidemia    Hypertension    Numbness of feet 10/15/2014   Obesity    Osteopenia     Current Outpatient Medications:    aspirin 81 MG tablet, Take 81 mg by mouth daily. (Patient not taking: Reported on 10/07/2021), Disp: , Rfl:    bisoprolol (ZEBETA) 5 MG tablet, Take 5 mg by mouth daily. (Patient not taking: Reported on 10/07/2021), Disp: , Rfl:    Cholecalciferol (VITAMIN D) 2000 UNITS CAPS, Take 1 capsule by mouth daily., Disp: , Rfl:    CINNAMON PO, Take 1,000 mg by mouth daily., Disp: , Rfl:    escitalopram (LEXAPRO) 10 MG tablet, Take 10 mg by mouth daily., Disp: , Rfl:    ibuprofen (ADVIL) 800 MG tablet, Take 1 tablet (800 mg total) by mouth every 6 (six) hours as needed., Disp: 60 tablet, Rfl: 1   loratadine (CLARITIN) 10 MG tablet, Take 10 mg by mouth daily as needed for allergies. (Patient not taking: Reported on 10/07/2021), Disp: , Rfl:    LORazepam (ATIVAN) 0.5 MG tablet, Take 0.5 mg by mouth every 8 (eight) hours as needed for anxiety (FOR ANXIETY). (Patient not taking: Reported on 10/07/2021), Disp: , Rfl:    Multiple Vitamin (MULTI VITAMIN) TABS, daily., Disp: , Rfl:    omeprazole (PRILOSEC) 20 MG capsule, Take 20 mg by mouth daily., Disp: , Rfl:     oxyCODONE-acetaminophen (PERCOCET) 5-325 MG tablet, Take 1 tablet by mouth every 4 (four) hours as needed for severe pain., Disp: 30 tablet, Rfl: 0   pravastatin (PRAVACHOL) 40 MG tablet, Take 40 mg by mouth daily., Disp: , Rfl:    triamcinolone cream (KENALOG) 0.1 %, Apply topically as needed., Disp: , Rfl:    valsartan (DIOVAN) 80 MG tablet, Take 80 mg by mouth daily., Disp: , Rfl:   Social History   Tobacco Use  Smoking Status Former   Packs/day: 0.50   Years: 20.00   Total pack years: 10.00   Types: Cigarettes  Smokeless Tobacco Never  Tobacco Comments   quit 15 years    No Known Allergies Objective:  There were no vitals filed for this visit. There is no height or weight on file to calculate BMI. Constitutional Well developed. Well nourished.  Vascular Foot warm and well perfused. Capillary refill normal to all digits.   Neurologic Normal speech. Oriented to person, place, and time. Epicritic sensation to light touch grossly present bilaterally.  Dermatologic Skin healing well without signs of infection. Skin edges well coapted without signs of infection.  Orthopedic: Tenderness to palpation noted about the surgical site.   Radiographs: 3 views of skeletally mature adult left foot: Arthroplasty of the left fifth digit  noted.  No other bony abnormalities noted Assessment:   1. Hammertoe of left foot   2. Status post foot surgery    Plan:  Patient was evaluated and treated and all questions answered.  S/p foot surgery left -Progressing as expected post-operatively. -XR: See above -WB Status: Weightbearing as tolerated in surgical shoe -Sutures: Intact.  No clinical signs of Deis is noted no complication noted. -Medications: None -Foot redressed.  No follow-ups on file.

## 2022-05-13 ENCOUNTER — Ambulatory Visit (INDEPENDENT_AMBULATORY_CARE_PROVIDER_SITE_OTHER): Payer: Medicare PPO | Admitting: Podiatry

## 2022-05-13 DIAGNOSIS — M2042 Other hammer toe(s) (acquired), left foot: Secondary | ICD-10-CM

## 2022-05-13 DIAGNOSIS — Z9889 Other specified postprocedural states: Secondary | ICD-10-CM

## 2022-05-13 NOTE — Progress Notes (Signed)
Subjective:  Patient ID: Paula Dunn, female    DOB: 1951-08-28,  MRN: [id number]  Chief Complaint  Patient presents with   Routine Post Op     POV #2 DOS 04/22/2022 LT 5TH HAMMERTOE CORRECTION    DOS: 04/22/2022 Procedure: Left fifth digit hammertoe arthroplasty  71 y.o. female returns for post-op check.  Patient states that she is doing okay.  She states that the pain is very minimal she is ambulating with surgical shoe.  She denies any other acute complaints.  Review of Systems: Negative except as noted in the HPI. Denies N/V/F/Ch.  Past Medical History:  Diagnosis Date   Diabetes mellitus    Diverticulosis 12/2003   Headache(784.0) 01/02/2014   Heel spur    History of hematuria    History of UTI    Hyperlipidemia    Hypertension    Numbness of feet 10/15/2014   Obesity    Osteopenia     Current Outpatient Medications:    aspirin 81 MG tablet, Take 81 mg by mouth daily. (Patient not taking: Reported on 10/07/2021), Disp: , Rfl:    bisoprolol (ZEBETA) 5 MG tablet, Take 5 mg by mouth daily. (Patient not taking: Reported on 10/07/2021), Disp: , Rfl:    Cholecalciferol (VITAMIN D) 2000 UNITS CAPS, Take 1 capsule by mouth daily., Disp: , Rfl:    CINNAMON PO, Take 1,000 mg by mouth daily., Disp: , Rfl:    escitalopram (LEXAPRO) 10 MG tablet, Take 10 mg by mouth daily., Disp: , Rfl:    ibuprofen (ADVIL) 800 MG tablet, Take 1 tablet (800 mg total) by mouth every 6 (six) hours as needed., Disp: 60 tablet, Rfl: 1   loratadine (CLARITIN) 10 MG tablet, Take 10 mg by mouth daily as needed for allergies. (Patient not taking: Reported on 10/07/2021), Disp: , Rfl:    LORazepam (ATIVAN) 0.5 MG tablet, Take 0.5 mg by mouth every 8 (eight) hours as needed for anxiety (FOR ANXIETY). (Patient not taking: Reported on 10/07/2021), Disp: , Rfl:    Multiple Vitamin (MULTI VITAMIN) TABS, daily., Disp: , Rfl:    omeprazole (PRILOSEC) 20 MG capsule, Take 20 mg by mouth daily., Disp: , Rfl:     oxyCODONE-acetaminophen (PERCOCET) 5-325 MG tablet, Take 1 tablet by mouth every 4 (four) hours as needed for severe pain., Disp: 30 tablet, Rfl: 0   pravastatin (PRAVACHOL) 40 MG tablet, Take 40 mg by mouth daily., Disp: , Rfl:    triamcinolone cream (KENALOG) 0.1 %, Apply topically as needed., Disp: , Rfl:    valsartan (DIOVAN) 80 MG tablet, Take 80 mg by mouth daily., Disp: , Rfl:   Social History   Tobacco Use  Smoking Status Former   Packs/day: 0.50   Years: 20.00   Total pack years: 10.00   Types: Cigarettes  Smokeless Tobacco Never  Tobacco Comments   quit 15 years    No Known Allergies Objective:  There were no vitals filed for this visit. There is no height or weight on file to calculate BMI. Constitutional Well developed. Well nourished.  Vascular Foot warm and well perfused. Capillary refill normal to all digits.   Neurologic Normal speech. Oriented to person, place, and time. Epicritic sensation to light touch grossly present bilaterally.  Dermatologic Skin completely reepithelialized.  No signs of Deis is noted.  Good reduction of hammertoe and deformity noted.  Orthopedic: No further tenderness to palpation noted about the surgical site.   Radiographs: 3 views of skeletally mature adult left foot:  Arthroplasty of the left fifth digit noted.  No other bony abnormalities noted Assessment:   1. Hammertoe of left foot   2. Status post foot surgery     Plan:  Patient was evaluated and treated and all questions answered.  S/p foot surgery left -Progressing as expected post-operatively. -XR: See above -WB Status: Weightbearing as tolerated in surgical shoe -Sutures: Removed no clinical signs of Deis is noted no complication noted. -Medications: None -Clinically healed and patient is officially discharged from my care.  Good reduction of hammertoe noted.  No follow-ups on file.

## 2022-05-21 DIAGNOSIS — R1010 Upper abdominal pain, unspecified: Secondary | ICD-10-CM | POA: Diagnosis not present

## 2022-05-21 DIAGNOSIS — I10 Essential (primary) hypertension: Secondary | ICD-10-CM | POA: Diagnosis not present

## 2022-05-21 DIAGNOSIS — R42 Dizziness and giddiness: Secondary | ICD-10-CM | POA: Diagnosis not present

## 2022-05-21 DIAGNOSIS — R109 Unspecified abdominal pain: Secondary | ICD-10-CM | POA: Diagnosis not present

## 2022-05-22 ENCOUNTER — Ambulatory Visit
Admission: RE | Admit: 2022-05-22 | Discharge: 2022-05-22 | Disposition: A | Discharge status: Home / Self Care | Payer: Medicare PPO | Source: Ambulatory Visit | Attending: Internal Medicine | Admitting: Internal Medicine

## 2022-05-22 ENCOUNTER — Other Ambulatory Visit: Payer: Self-pay | Admitting: Internal Medicine

## 2022-05-22 DIAGNOSIS — R109 Unspecified abdominal pain: Secondary | ICD-10-CM

## 2022-05-28 DIAGNOSIS — E1142 Type 2 diabetes mellitus with diabetic polyneuropathy: Secondary | ICD-10-CM | POA: Diagnosis not present

## 2022-05-28 DIAGNOSIS — E7800 Pure hypercholesterolemia, unspecified: Secondary | ICD-10-CM | POA: Diagnosis not present

## 2022-05-28 DIAGNOSIS — I10 Essential (primary) hypertension: Secondary | ICD-10-CM | POA: Diagnosis not present

## 2022-06-09 ENCOUNTER — Telehealth: Payer: Self-pay | Admitting: *Deleted

## 2022-06-09 NOTE — Telephone Encounter (Signed)
Patient has been contacted with recommendations,verbalized understanding.

## 2022-06-09 NOTE — Telephone Encounter (Signed)
Patient is calling to ask if she can get a pedicure, recently had stitches removed. Please advise.

## 2022-06-15 DIAGNOSIS — Z604 Social exclusion and rejection: Secondary | ICD-10-CM | POA: Diagnosis not present

## 2022-06-15 DIAGNOSIS — I10 Essential (primary) hypertension: Secondary | ICD-10-CM | POA: Diagnosis not present

## 2022-06-15 DIAGNOSIS — E785 Hyperlipidemia, unspecified: Secondary | ICD-10-CM | POA: Diagnosis not present

## 2022-06-15 DIAGNOSIS — F411 Generalized anxiety disorder: Secondary | ICD-10-CM | POA: Diagnosis not present

## 2022-06-15 DIAGNOSIS — K219 Gastro-esophageal reflux disease without esophagitis: Secondary | ICD-10-CM | POA: Diagnosis not present

## 2022-06-15 DIAGNOSIS — K5900 Constipation, unspecified: Secondary | ICD-10-CM | POA: Diagnosis not present

## 2022-06-15 DIAGNOSIS — F3341 Major depressive disorder, recurrent, in partial remission: Secondary | ICD-10-CM | POA: Diagnosis not present

## 2022-06-15 DIAGNOSIS — E1136 Type 2 diabetes mellitus with diabetic cataract: Secondary | ICD-10-CM | POA: Diagnosis not present

## 2022-06-19 DIAGNOSIS — M6281 Muscle weakness (generalized): Secondary | ICD-10-CM | POA: Diagnosis not present

## 2022-06-19 DIAGNOSIS — R262 Difficulty in walking, not elsewhere classified: Secondary | ICD-10-CM | POA: Diagnosis not present

## 2022-07-03 DIAGNOSIS — R262 Difficulty in walking, not elsewhere classified: Secondary | ICD-10-CM | POA: Diagnosis not present

## 2022-07-03 DIAGNOSIS — M6281 Muscle weakness (generalized): Secondary | ICD-10-CM | POA: Diagnosis not present

## 2022-07-07 DIAGNOSIS — M6281 Muscle weakness (generalized): Secondary | ICD-10-CM | POA: Diagnosis not present

## 2022-07-07 DIAGNOSIS — R262 Difficulty in walking, not elsewhere classified: Secondary | ICD-10-CM | POA: Diagnosis not present

## 2022-07-29 DIAGNOSIS — M6281 Muscle weakness (generalized): Secondary | ICD-10-CM | POA: Diagnosis not present

## 2022-07-29 DIAGNOSIS — R262 Difficulty in walking, not elsewhere classified: Secondary | ICD-10-CM | POA: Diagnosis not present

## 2022-08-05 DIAGNOSIS — H60543 Acute eczematoid otitis externa, bilateral: Secondary | ICD-10-CM | POA: Diagnosis not present

## 2022-08-05 DIAGNOSIS — G6289 Other specified polyneuropathies: Secondary | ICD-10-CM | POA: Diagnosis not present

## 2022-08-05 DIAGNOSIS — F411 Generalized anxiety disorder: Secondary | ICD-10-CM | POA: Diagnosis not present

## 2022-08-07 DIAGNOSIS — M6281 Muscle weakness (generalized): Secondary | ICD-10-CM | POA: Diagnosis not present

## 2022-08-07 DIAGNOSIS — R262 Difficulty in walking, not elsewhere classified: Secondary | ICD-10-CM | POA: Diagnosis not present

## 2022-08-17 DIAGNOSIS — M6281 Muscle weakness (generalized): Secondary | ICD-10-CM | POA: Diagnosis not present

## 2022-08-17 DIAGNOSIS — R262 Difficulty in walking, not elsewhere classified: Secondary | ICD-10-CM | POA: Diagnosis not present

## 2022-08-24 DIAGNOSIS — R262 Difficulty in walking, not elsewhere classified: Secondary | ICD-10-CM | POA: Diagnosis not present

## 2022-08-24 DIAGNOSIS — M6281 Muscle weakness (generalized): Secondary | ICD-10-CM | POA: Diagnosis not present

## 2022-08-31 DIAGNOSIS — R262 Difficulty in walking, not elsewhere classified: Secondary | ICD-10-CM | POA: Diagnosis not present

## 2022-08-31 DIAGNOSIS — M6281 Muscle weakness (generalized): Secondary | ICD-10-CM | POA: Diagnosis not present

## 2022-09-14 DIAGNOSIS — M6281 Muscle weakness (generalized): Secondary | ICD-10-CM | POA: Diagnosis not present

## 2022-09-14 DIAGNOSIS — R262 Difficulty in walking, not elsewhere classified: Secondary | ICD-10-CM | POA: Diagnosis not present

## 2022-09-21 DIAGNOSIS — G629 Polyneuropathy, unspecified: Secondary | ICD-10-CM | POA: Diagnosis not present

## 2022-09-21 DIAGNOSIS — R262 Difficulty in walking, not elsewhere classified: Secondary | ICD-10-CM | POA: Diagnosis not present

## 2022-09-21 DIAGNOSIS — M6281 Muscle weakness (generalized): Secondary | ICD-10-CM | POA: Diagnosis not present

## 2022-10-02 DIAGNOSIS — M6281 Muscle weakness (generalized): Secondary | ICD-10-CM | POA: Diagnosis not present

## 2022-10-02 DIAGNOSIS — R262 Difficulty in walking, not elsewhere classified: Secondary | ICD-10-CM | POA: Diagnosis not present

## 2022-10-14 DIAGNOSIS — Z780 Asymptomatic menopausal state: Secondary | ICD-10-CM | POA: Diagnosis not present

## 2022-10-14 DIAGNOSIS — Z23 Encounter for immunization: Secondary | ICD-10-CM | POA: Diagnosis not present

## 2022-10-14 DIAGNOSIS — G629 Polyneuropathy, unspecified: Secondary | ICD-10-CM | POA: Diagnosis not present

## 2022-10-14 DIAGNOSIS — Z0000 Encounter for general adult medical examination without abnormal findings: Secondary | ICD-10-CM | POA: Diagnosis not present

## 2022-10-14 DIAGNOSIS — I10 Essential (primary) hypertension: Secondary | ICD-10-CM | POA: Diagnosis not present

## 2022-10-14 DIAGNOSIS — Z1331 Encounter for screening for depression: Secondary | ICD-10-CM | POA: Diagnosis not present

## 2022-10-14 DIAGNOSIS — E7800 Pure hypercholesterolemia, unspecified: Secondary | ICD-10-CM | POA: Diagnosis not present

## 2022-10-14 DIAGNOSIS — R42 Dizziness and giddiness: Secondary | ICD-10-CM | POA: Diagnosis not present

## 2022-10-14 DIAGNOSIS — H60543 Acute eczematoid otitis externa, bilateral: Secondary | ICD-10-CM | POA: Diagnosis not present

## 2022-10-14 DIAGNOSIS — E1142 Type 2 diabetes mellitus with diabetic polyneuropathy: Secondary | ICD-10-CM | POA: Diagnosis not present

## 2022-10-14 LAB — LAB REPORT - SCANNED: A1c: 8.9

## 2022-10-20 DIAGNOSIS — Z1231 Encounter for screening mammogram for malignant neoplasm of breast: Secondary | ICD-10-CM | POA: Diagnosis not present

## 2022-10-28 DIAGNOSIS — Z780 Asymptomatic menopausal state: Secondary | ICD-10-CM | POA: Diagnosis not present

## 2022-11-04 DIAGNOSIS — L299 Pruritus, unspecified: Secondary | ICD-10-CM | POA: Diagnosis not present

## 2022-12-21 DIAGNOSIS — Z5181 Encounter for therapeutic drug level monitoring: Secondary | ICD-10-CM | POA: Diagnosis not present

## 2022-12-21 DIAGNOSIS — E7800 Pure hypercholesterolemia, unspecified: Secondary | ICD-10-CM | POA: Diagnosis not present

## 2023-01-26 DIAGNOSIS — H40023 Open angle with borderline findings, high risk, bilateral: Secondary | ICD-10-CM | POA: Diagnosis not present

## 2023-01-26 DIAGNOSIS — H25813 Combined forms of age-related cataract, bilateral: Secondary | ICD-10-CM | POA: Diagnosis not present

## 2023-01-26 DIAGNOSIS — H35032 Hypertensive retinopathy, left eye: Secondary | ICD-10-CM | POA: Diagnosis not present

## 2023-01-26 DIAGNOSIS — E119 Type 2 diabetes mellitus without complications: Secondary | ICD-10-CM | POA: Diagnosis not present

## 2023-04-15 DIAGNOSIS — E1142 Type 2 diabetes mellitus with diabetic polyneuropathy: Secondary | ICD-10-CM | POA: Diagnosis not present

## 2023-04-15 DIAGNOSIS — I10 Essential (primary) hypertension: Secondary | ICD-10-CM | POA: Diagnosis not present

## 2023-04-15 DIAGNOSIS — K219 Gastro-esophageal reflux disease without esophagitis: Secondary | ICD-10-CM | POA: Diagnosis not present

## 2023-04-15 DIAGNOSIS — E7800 Pure hypercholesterolemia, unspecified: Secondary | ICD-10-CM | POA: Diagnosis not present

## 2023-04-15 DIAGNOSIS — G629 Polyneuropathy, unspecified: Secondary | ICD-10-CM | POA: Diagnosis not present

## 2023-04-15 DIAGNOSIS — F3341 Major depressive disorder, recurrent, in partial remission: Secondary | ICD-10-CM | POA: Diagnosis not present

## 2023-04-15 LAB — LAB REPORT - SCANNED
A1c: 6.9
EGFR: 93

## 2023-05-19 DIAGNOSIS — G629 Polyneuropathy, unspecified: Secondary | ICD-10-CM | POA: Diagnosis not present

## 2023-05-19 DIAGNOSIS — Z79899 Other long term (current) drug therapy: Secondary | ICD-10-CM | POA: Diagnosis not present

## 2023-05-19 DIAGNOSIS — B351 Tinea unguium: Secondary | ICD-10-CM | POA: Diagnosis not present

## 2023-05-19 DIAGNOSIS — Z0000 Encounter for general adult medical examination without abnormal findings: Secondary | ICD-10-CM | POA: Diagnosis not present

## 2023-05-19 DIAGNOSIS — K219 Gastro-esophageal reflux disease without esophagitis: Secondary | ICD-10-CM | POA: Diagnosis not present

## 2023-07-31 ENCOUNTER — Ambulatory Visit (INDEPENDENT_AMBULATORY_CARE_PROVIDER_SITE_OTHER): Payer: Medicare PPO

## 2023-07-31 ENCOUNTER — Encounter (HOSPITAL_COMMUNITY): Payer: Self-pay | Admitting: Urgent Care

## 2023-07-31 ENCOUNTER — Ambulatory Visit (HOSPITAL_COMMUNITY)
Admission: EM | Admit: 2023-07-31 | Discharge: 2023-07-31 | Disposition: A | Discharge status: Home / Self Care | Payer: Medicare PPO | Attending: Urgent Care | Admitting: Urgent Care

## 2023-07-31 DIAGNOSIS — M19072 Primary osteoarthritis, left ankle and foot: Secondary | ICD-10-CM

## 2023-07-31 DIAGNOSIS — S9032XA Contusion of left foot, initial encounter: Secondary | ICD-10-CM | POA: Diagnosis not present

## 2023-07-31 DIAGNOSIS — M7732 Calcaneal spur, left foot: Secondary | ICD-10-CM | POA: Diagnosis not present

## 2023-07-31 DIAGNOSIS — M79672 Pain in left foot: Secondary | ICD-10-CM | POA: Diagnosis not present

## 2023-07-31 MED ORDER — DICLOFENAC SODIUM 1 % EX GEL: 2.0000 g | Freq: Four times a day (QID) | CUTANEOUS | 0 refills | Status: AC

## 2023-07-31 NOTE — Discharge Instructions (Addendum)
Your x-ray does not show any obvious fracture.  I am still awaiting the radiologist to read, and will call if there are any changes to your treatment plan.  Please start taking 1000 mg of Tylenol every 8 hours as needed for pain and discomfort. I have called in topical Voltaren gel which is an anti-inflammatory you can rub on the affected area up to 4 times daily.  This should help with pain and swelling.  Please elevate your foot when not in use, consider icing the area as well. Wear the ACE wrap until symptoms subside.

## 2023-07-31 NOTE — ED Provider Notes (Signed)
[facility]    CSN: [id number] Arrival date & time: 07/31/23  1202      History   Chief Complaint No chief complaint on file.   HPI Paula Dunn is a 72 y.o. female.   72 year old female presents today due to concerns of left foot pain.  She states roughly 1 week ago she was pulling her car into the garage, but the door was slightly still open and her foot was hanging out.  She was wearing sandals at the time.  During this, the door forcefully slammed into the lateral aspect of her left foot.  Since that time she has had intermittent pain.  She is able to fully ambulate, but weightbearing does cause an increase in the pressure.  She does have baseline neuropathy, but reports normal sensation.  Her main concern is intermittent swelling.  It is swelling over the foot and ankle region laterally, which seems to improve at night and with elevation.  She denies any cuts in the skin, redness of the skin, significant bruising.  She reports full range of motion to her ankle and toes.  She denies any calf pain, tenderness or warmth of the skin.      Past Medical History:  Diagnosis Date   Diabetes mellitus    Diverticulosis 12/2003   Headache(784.0) 01/02/2014   Heel spur    History of hematuria    History of UTI    Hyperlipidemia    Hypertension    Numbness of feet 10/15/2014   Obesity    Osteopenia     Patient Active Problem List   Diagnosis Date Noted   Essential hypertension 10/07/2021   Hyperlipidemia 10/07/2021   Numbness of feet 10/15/2014   Generalized anxiety disorder 04/10/2014   Headache 01/02/2014    Past Surgical History:  Procedure Laterality Date   COLONOSCOPY  12/2003   ESOPHAGOGASTRODUODENOSCOPY  12/2003   EXPLORATORY LAPAROTOMY     DR. DAVIS    BENIGN   HAMMERTOE     THYROID NEEDLE BIOPSY     VARICOSE VEIN SURGERY  06/2009    OB History   No obstetric history on file.      Home Medications    Prior to Admission medications    Medication Sig Start Date End Date Taking? Authorizing Provider  diclofenac Sodium (VOLTAREN) 1 % GEL Apply 2 g topically 4 (four) times daily. 07/31/23  Yes Markian Glockner L, PA  Cholecalciferol (VITAMIN D) 2000 UNITS CAPS Take 1 capsule by mouth daily.    [provider]  CINNAMON PO Take 1,000 mg by mouth daily.    [provider]  escitalopram (LEXAPRO) 10 MG tablet Take 10 mg by mouth daily.    [provider]  ibuprofen (ADVIL) 800 MG tablet Take 1 tablet (800 mg total) by mouth every 6 (six) hours as needed. 04/22/22   Candelaria Stagers, DPM  Multiple Vitamin (MULTI VITAMIN) TABS daily.    [provider]  omeprazole (PRILOSEC) 20 MG capsule Take 20 mg by mouth daily. 08/22/19   [provider]  oxyCODONE-acetaminophen (PERCOCET) 5-325 MG tablet Take 1 tablet by mouth every 4 (four) hours as needed for severe pain. 04/22/22   Candelaria Stagers, DPM  pravastatin (PRAVACHOL) 40 MG tablet Take 40 mg by mouth daily.    [provider]  triamcinolone cream (KENALOG) 0.1 % Apply topically as needed. 03/13/20   [provider]  valsartan (DIOVAN) 80 MG tablet Take 80 mg by mouth  daily.    [provider]    Family History Family History  Problem Relation Age of Onset   Breast cancer Sister    Migraines Neg Hx     Social History Social History   Tobacco Use   Smoking status: Former    Current packs/day: 0.50    Average packs/day: 0.5 packs/day for 20.0 years (10.0 ttl pk-yrs)    Types: Cigarettes   Smokeless tobacco: Never   Tobacco comments:    quit 15 years  Substance Use Topics   Alcohol use: Yes    Comment: OCCASIONAL   Drug use: No     Allergies   Patient has no known allergies.   Review of Systems Review of Systems As per HPI  Physical Exam Triage Vital Signs ED Triage Vitals  Encounter Vitals Group     BP 07/31/23 1316 (!) 164/88     Systolic BP Percentile --      Diastolic BP Percentile --       Pulse Rate 07/31/23 1316 (!) 58     Resp 07/31/23 1316 18     Temp 07/31/23 1316 98.2 F (36.8 C)     Temp Source 07/31/23 1316 Oral     SpO2 07/31/23 1316 98 %     Weight 07/31/23 1320 220 lb (99.8 kg)     Height 07/31/23 1320 5\' 6"  (1.676 m)     Head Circumference --      Peak Flow --      Pain Score --      Pain Loc --      Pain Education --      Exclude from Growth Chart --    No data found.  Updated Vital Signs BP (!) 164/88 (BP Location: Left Arm)   Pulse (!) 58   Temp 98.2 F (36.8 C) (Oral)   Resp 18   Ht 5\' 6"  (1.676 m)   Wt 220 lb (99.8 kg)   SpO2 98%   BMI 35.51 kg/m   Visual Acuity Right Eye Distance:   Left Eye Distance:   Bilateral Distance:    Right Eye Near:   Left Eye Near:    Bilateral Near:     Physical Exam Vitals and nursing note reviewed.  Constitutional:      General: She is not in acute distress.    Appearance: Normal appearance. She is normal weight. She is not ill-appearing, toxic-appearing or diaphoretic.  HENT:     Head: Normocephalic and atraumatic.  Cardiovascular:     Rate and Rhythm: Bradycardia present.     Pulses: Normal pulses.  Pulmonary:     Effort: Pulmonary effort is normal. No respiratory distress.  Musculoskeletal:     Left lower leg: Normal. No swelling, deformity, lacerations, tenderness or bony tenderness.     Left ankle: Normal. No swelling, deformity, ecchymosis or lacerations. No tenderness. Normal range of motion. Anterior drawer test negative. Normal pulse.     Left Achilles Tendon: Normal. No tenderness or defects. Thompson's test negative.     Left foot: Normal capillary refill. Tenderness and bony tenderness present. No swelling, deformity or crepitus. Normal pulse.       Feet:     Comments: Negative homan sign on left  Neurological:     Mental Status: She is alert.      UC Treatments / Results  Labs (all labs ordered are listed, but only abnormal results are displayed) Labs Reviewed - No data to  display  EKG  Radiology No results found.  Procedures Procedures (including critical care time)  Medications Ordered in UC Medications - No data to display  Initial Impression / Assessment and Plan / UC Course  I have reviewed the triage vital signs and the nursing notes.  Pertinent labs & imaging results that were available during my care of the patient were reviewed by me and considered in my medical decision making (see chart for details).     *** Final Clinical Impressions(s) / UC Diagnoses   Final diagnoses:  Contusion of left foot, initial encounter  Osteoarthritis of left foot, unspecified osteoarthritis type  Calcaneal spur of left foot     Discharge Instructions      Your x-ray does not show any obvious fracture.  I am still awaiting the radiologist to read, and will call if there are any changes to your treatment plan.  Please start taking 1000 mg of Tylenol every 8 hours as needed for pain and discomfort. I have called in topical Voltaren gel which is an anti-inflammatory you can rub on the affected area up to 4 times daily.  This should help with pain and swelling.  Please elevate your foot when not in use, consider icing the area as well. Wear the ACE wrap until symptoms subside.     ED Prescriptions     Medication Sig Dispense Auth. Provider   diclofenac Sodium (VOLTAREN) 1 % GEL Apply 2 g topically 4 (four) times daily. 100 g Kensleigh Gates, [place] L, [place]      PDMP not reviewed this encounter.

## 2023-07-31 NOTE — ED Triage Notes (Signed)
Patient presents with left foot swelling, hit in car door x 1 week and swelling goes up and down.

## 2023-09-24 ENCOUNTER — Ambulatory Visit: Payer: Medicare PPO | Admitting: Podiatry

## 2023-10-18 DIAGNOSIS — E1142 Type 2 diabetes mellitus with diabetic polyneuropathy: Secondary | ICD-10-CM | POA: Diagnosis not present

## 2023-10-18 DIAGNOSIS — K219 Gastro-esophageal reflux disease without esophagitis: Secondary | ICD-10-CM | POA: Diagnosis not present

## 2023-10-18 DIAGNOSIS — Z23 Encounter for immunization: Secondary | ICD-10-CM | POA: Diagnosis not present

## 2023-10-18 DIAGNOSIS — E559 Vitamin D deficiency, unspecified: Secondary | ICD-10-CM | POA: Diagnosis not present

## 2023-10-18 DIAGNOSIS — Z0000 Encounter for general adult medical examination without abnormal findings: Secondary | ICD-10-CM | POA: Diagnosis not present

## 2023-10-18 DIAGNOSIS — F3342 Major depressive disorder, recurrent, in full remission: Secondary | ICD-10-CM | POA: Diagnosis not present

## 2023-10-18 DIAGNOSIS — Z79899 Other long term (current) drug therapy: Secondary | ICD-10-CM | POA: Diagnosis not present

## 2023-10-18 DIAGNOSIS — G629 Polyneuropathy, unspecified: Secondary | ICD-10-CM | POA: Diagnosis not present

## 2023-10-18 DIAGNOSIS — E7800 Pure hypercholesterolemia, unspecified: Secondary | ICD-10-CM | POA: Diagnosis not present

## 2023-10-18 LAB — LAB REPORT - SCANNED
A1c: 7.2
Albumin, Urine POC: 1.73
Creatinine, POC: 212 mg/dL
Microalb Creat Ratio: 8.2

## 2023-10-19 LAB — LAB REPORT - SCANNED: EGFR: 87

## 2023-10-25 DIAGNOSIS — Z1231 Encounter for screening mammogram for malignant neoplasm of breast: Secondary | ICD-10-CM | POA: Diagnosis not present

## 2023-11-04 ENCOUNTER — Other Ambulatory Visit: Payer: Self-pay | Admitting: Podiatry

## 2023-12-20 ENCOUNTER — Encounter: Payer: Self-pay | Admitting: Skilled Nursing Facility1

## 2023-12-20 ENCOUNTER — Encounter: Payer: Medicare PPO | Attending: Internal Medicine | Admitting: Skilled Nursing Facility1

## 2023-12-20 DIAGNOSIS — E119 Type 2 diabetes mellitus without complications: Secondary | ICD-10-CM | POA: Diagnosis not present

## 2023-12-20 NOTE — Progress Notes (Signed)
Other Dx: Hyperlipdemia HTN Diverticulosis    Supplements: Pt state she takes many OTC supplements   A1C 7.2  Pt states she took herself off metformin stating she does not like to take medications. Pt states she does not check her blood sugars. Pt states she feels she does not even sleep at night half the time. Pt states she does bible study on Wednesday. Pt does not complain of hypoglycemic symptoms. Pt states she loves sweets. Pt states she does struggle with motivation due to depression.   Diabetes Self-Management Education  Visit Type: First/Initial  Appt. Start Time: 1:48 Appt. End Time: 2:48  12/20/2023  Ms. Ardeen Fillers, identified by name and date of birth, is a 73 y.o. female with a diagnosis of Diabetes: Type 2.   ASSESSMENT  There were no vitals taken for this visit. There is no height or weight on file to calculate BMI.   Diabetes Self-Management Education - 12/20/23 1407       Visit Information   Visit Type First/Initial      Initial Visit   Diabetes Type Type 2    Are you currently following a meal plan? No    Are you taking your medications as prescribed? Not on Medications      Health Coping   How would you rate your overall health? Fair      Psychosocial Assessment   Patient Belief/Attitude about Diabetes Denial    What is the hardest part about your diabetes right now, causing you the most concern, or is the most worrisome to you about your diabetes?   Making healty food and beverage choices    Self-management support Friends;Family    Patient Concerns Nutrition/Meal planning;Weight Control    Special Needs None    Preferred Learning Style Visual;Auditory    Learning Readiness Not Ready      Pre-Education Assessment   Patient understands the diabetes disease and treatment process. Needs Instruction    Patient understands incorporating nutritional management into lifestyle. Needs Instruction    Patient undertands incorporating physical activity  into lifestyle. Needs Instruction    Patient understands using medications safely. Needs Instruction    Patient understands monitoring blood glucose, interpreting and using results Needs Instruction    Patient understands prevention, detection, and treatment of acute complications. Needs Instruction    Patient understands prevention, detection, and treatment of chronic complications. Needs Instruction    Patient understands how to develop strategies to address psychosocial issues. Needs Instruction    Patient understands how to develop strategies to promote health/change behavior. Needs Instruction      Complications   Last HgB A1C per patient/outside source 7.2 %    How often do you check your blood sugar? 0 times/day (not testing)    Have you had a dilated eye exam in the past 12 months? Yes    Have you had a dental exam in the past 12 months? Yes    Are you checking your feet? Yes    How many days per week are you checking your feet? 4      Dietary Intake   Breakfast hot tea + lemon + honey + banana + breakfats bar    Lunch eaten out or turley and cheese sandwich + chips + fruit    Dinner briscuit + veggies + cornbread    Beverage(s) juice, water      Activity / Exercise   Activity / Exercise Type ADL's    How many days per week do  you exercise? 0    How many minutes per day do you exercise? 0    Total minutes per week of exercise 0      Patient Education   Previous Diabetes Education No    Disease Pathophysiology Definition of diabetes, type 1 and 2, and the diagnosis of diabetes;Factors that contribute to the development of diabetes    Healthy Eating Role of diet in the treatment of diabetes and the relationship between the three main macronutrients and blood glucose level;Food label reading, portion sizes and measuring food.;Plate Method;Reviewed blood glucose goals for pre and post meals and how to evaluate the patients' food intake on their blood glucose level.    Being Active  Role of exercise on diabetes management, blood pressure control and cardiac health.    Monitoring Daily foot exams;Yearly dilated eye exam    Acute complications Discussed and identified patients' prevention, symptoms, and treatment of hyperglycemia.;Taught prevention, symptoms, and  treatment of hypoglycemia - the 15 rule.    Chronic complications Dental care;Retinopathy and reason for yearly dilated eye exams;Nephropathy, what it is, prevention of, the use of ACE, ARB's and early detection of through urine microalbumia.;Assessed and discussed foot care and prevention of foot problems    Diabetes Stress and Support Helped patient identify a support system for diabetes management;Role of stress on diabetes      Individualized Goals (developed by patient)   Nutrition Follow meal plan discussed;General guidelines for healthy choices and portions discussed    Physical Activity Exercise 3-5 times per week;15 minutes per day    Problem Solving Eating Pattern    Reducing Risk do foot checks daily;treat hypoglycemia with 15 grams of carbs if blood glucose less than 70mg /dL      Post-Education Assessment   Patient understands the diabetes disease and treatment process. Demonstrates understanding / competency    Patient understands incorporating nutritional management into lifestyle. Demonstrates understanding / competency    Patient undertands incorporating physical activity into lifestyle. Demonstrates understanding / competency    Patient understands using medications safely. Demonstrates understanding / competency    Patient understands monitoring blood glucose, interpreting and using results Demonstrates understanding / competency    Patient understands prevention, detection, and treatment of acute complications. Demonstrates understanding / competency    Patient understands prevention, detection, and treatment of chronic complications. Demonstrates understanding / competency    Patient understands  how to develop strategies to address psychosocial issues. Demonstrates understanding / competency    Patient understands how to develop strategies to promote health/change behavior. Demonstrates understanding / competency      Outcomes   Expected Outcomes Demonstrated interest in learning but significant barriers to change    Future DMSE 2 months    Program Status Completed             Individualized Plan for Diabetes Self-Management Training:   Learning Objective:  Patient will have a greater understanding of diabetes self-management. Patient education plan is to attend individual and/or group sessions per assessed needs and concerns.    Expected Outcomes:  Demonstrated interest in learning but significant barriers to change  Education material provided: ADA - How to Thrive: A Guide for Your Journey with Diabetes, Meal plan card, My Plate, and Diabetes Resources  If problems or questions, patient to contact team via:  Phone and Email  Future DSME appointment: 2 months

## 2023-12-31 DIAGNOSIS — N390 Urinary tract infection, site not specified: Secondary | ICD-10-CM | POA: Diagnosis not present

## 2023-12-31 DIAGNOSIS — M25571 Pain in right ankle and joints of right foot: Secondary | ICD-10-CM | POA: Diagnosis not present

## 2023-12-31 DIAGNOSIS — R42 Dizziness and giddiness: Secondary | ICD-10-CM | POA: Diagnosis not present

## 2024-01-28 DIAGNOSIS — R109 Unspecified abdominal pain: Secondary | ICD-10-CM | POA: Diagnosis not present

## 2024-02-07 DIAGNOSIS — H25813 Combined forms of age-related cataract, bilateral: Secondary | ICD-10-CM | POA: Diagnosis not present

## 2024-02-07 DIAGNOSIS — H04123 Dry eye syndrome of bilateral lacrimal glands: Secondary | ICD-10-CM | POA: Diagnosis not present

## 2024-02-07 DIAGNOSIS — E119 Type 2 diabetes mellitus without complications: Secondary | ICD-10-CM | POA: Diagnosis not present

## 2024-02-07 DIAGNOSIS — H40023 Open angle with borderline findings, high risk, bilateral: Secondary | ICD-10-CM | POA: Diagnosis not present

## 2024-02-07 DIAGNOSIS — H524 Presbyopia: Secondary | ICD-10-CM | POA: Diagnosis not present

## 2024-02-29 DIAGNOSIS — M1990 Unspecified osteoarthritis, unspecified site: Secondary | ICD-10-CM | POA: Diagnosis not present

## 2024-02-29 DIAGNOSIS — Z8249 Family history of ischemic heart disease and other diseases of the circulatory system: Secondary | ICD-10-CM | POA: Diagnosis not present

## 2024-02-29 DIAGNOSIS — E1142 Type 2 diabetes mellitus with diabetic polyneuropathy: Secondary | ICD-10-CM | POA: Diagnosis not present

## 2024-02-29 DIAGNOSIS — Z87891 Personal history of nicotine dependence: Secondary | ICD-10-CM | POA: Diagnosis not present

## 2024-02-29 DIAGNOSIS — K219 Gastro-esophageal reflux disease without esophagitis: Secondary | ICD-10-CM | POA: Diagnosis not present

## 2024-02-29 DIAGNOSIS — E785 Hyperlipidemia, unspecified: Secondary | ICD-10-CM | POA: Diagnosis not present

## 2024-02-29 DIAGNOSIS — F325 Major depressive disorder, single episode, in full remission: Secondary | ICD-10-CM | POA: Diagnosis not present

## 2024-02-29 DIAGNOSIS — Z833 Family history of diabetes mellitus: Secondary | ICD-10-CM | POA: Diagnosis not present

## 2024-03-29 ENCOUNTER — Ambulatory Visit: Admitting: Podiatry

## 2024-03-29 DIAGNOSIS — L600 Ingrowing nail: Secondary | ICD-10-CM | POA: Diagnosis not present

## 2024-03-29 NOTE — Progress Notes (Signed)
 Subjective:  Patient ID: Paula Dunn, female    DOB: 02-17-1951,  MRN: [id number]  Chief Complaint  Patient presents with   Nail Problem    Right foot 3rd toe nail     73 y.o. female presents with the above complaint.  Patient is with right third digit medial border ingrown painful to touch hurts with ambulation or shoe pressure patient would like to discuss treatment options for this she has not seen Novo-Spirozine me for this denies any other acute complaints is hurting her hurts with ambulation or shoe pressure   Review of Systems: Negative except as noted in the HPI. Denies N/V/F/Ch.  Past Medical History:  Diagnosis Date   Diabetes mellitus    Diverticulosis 12/2003   Headache(784.0) 01/02/2014   Heel spur    History of hematuria    History of UTI    Hyperlipidemia    Hypertension    Numbness of feet 10/15/2014   Obesity    Osteopenia     Current Outpatient Medications:    Cholecalciferol (VITAMIN D) 2000 UNITS CAPS, Take 1 capsule by mouth daily., Disp: , Rfl:    CINNAMON PO, Take 1,000 mg by mouth daily., Disp: , Rfl:    diclofenac  Sodium (VOLTAREN ) 1 % GEL, Apply 2 g topically 4 (four) times daily., Disp: 100 g, Rfl: 0   escitalopram (LEXAPRO) 10 MG tablet, Take 10 mg by mouth daily., Disp: , Rfl:    ibuprofen  (ADVIL ) 800 MG tablet, Take 1 tablet (800 mg total) by mouth every 6 (six) hours as needed., Disp: 60 tablet, Rfl: 1   Multiple Vitamin (MULTI VITAMIN) TABS, daily., Disp: , Rfl:    omeprazole (PRILOSEC) 20 MG capsule, Take 20 mg by mouth daily., Disp: , Rfl:    oxyCODONE -acetaminophen  (PERCOCET) 5-325 MG tablet, Take 1 tablet by mouth every 4 (four) hours as needed for severe pain., Disp: 30 tablet, Rfl: 0   pravastatin (PRAVACHOL) 40 MG tablet, Take 40 mg by mouth daily., Disp: , Rfl:    triamcinolone cream (KENALOG) 0.1 %, Apply topically as needed., Disp: , Rfl:    valsartan (DIOVAN) 80 MG tablet, Take 80 mg by mouth daily., Disp: , Rfl:   Social History    Tobacco Use  Smoking Status Former   Current packs/day: 0.50   Average packs/day: 0.5 packs/day for 20.0 years (10.0 ttl pk-yrs)   Types: Cigarettes  Smokeless Tobacco Never  Tobacco Comments   quit 15 years    No Known Allergies Objective:  There were no vitals filed for this visit. There is no height or weight on file to calculate BMI. Constitutional Well developed. Well nourished.  Vascular Dorsalis pedis pulses palpable bilaterally. Posterior tibial pulses palpable bilaterally. Capillary refill normal to all digits.  No cyanosis or clubbing noted. Pedal hair growth normal.  Neurologic Normal speech. Oriented to person, place, and time. Epicritic sensation to light touch grossly present bilaterally.  Dermatologic Painful ingrowing nail at medial nail borders of the third nail right. No other open wounds. No skin lesions.  Orthopedic: Normal joint ROM without pain or crepitus bilaterally. No visible deformities. No bony tenderness.   Radiographs: None Assessment:  No diagnosis found. Plan:  Patient was evaluated and treated and all questions answered.  Ingrown Nail, right -Patient elects to proceed with minor surgery to remove ingrown toenail removal today. Consent reviewed and signed by patient. -Ingrown nail excised. See procedure note. -Educated on post-procedure care including soaking. Written instructions provided and reviewed. -Patient to follow  up in 2 weeks for nail check.  Procedure: Excision of Ingrown Toenail Location: Right 3rd toe medial nail borders. Anesthesia: Lidocaine 1% plain; 1.5 mL and Marcaine 0.5% plain; 1.5 mL, digital block. Skin Prep: Betadine. Dressing: Silvadene; telfa; dry, sterile, compression dressing. Technique: Following skin prep, the toe was exsanguinated and a tourniquet was secured at the base of the toe. The affected nail border was freed, split with a nail splitter, and excised. Chemical matrixectomy was then performed with  phenol and irrigated out with alcohol. The tourniquet was then removed and sterile dressing applied. Disposition: Patient tolerated procedure well. Patient to return in 2 weeks for follow-up.   No follow-ups on file.

## 2024-03-30 ENCOUNTER — Telehealth: Payer: Self-pay

## 2024-03-30 NOTE — Telephone Encounter (Signed)
 Patient called inquiring about her after care instructions. She stated that she can't remember what was said.

## 2024-04-03 ENCOUNTER — Telehealth: Payer: Self-pay | Admitting: Podiatry

## 2024-04-03 MED ORDER — CLOTRIMAZOLE-BETAMETHASONE 1-0.05 % EX CREA: 1.0000 | TOPICAL_CREAM | Freq: Every day | CUTANEOUS | 0 refills | Status: AC

## 2024-04-03 NOTE — Telephone Encounter (Signed)
 Patient stated pharmacy did not receive order for prescription, patient is requesting ointment for foot at [facility] in [place]. Patient contact telephone number, [phone]

## 2024-04-19 DIAGNOSIS — G629 Polyneuropathy, unspecified: Secondary | ICD-10-CM | POA: Diagnosis not present

## 2024-04-19 DIAGNOSIS — E66812 Obesity, class 2: Secondary | ICD-10-CM | POA: Diagnosis not present

## 2024-04-19 DIAGNOSIS — F3342 Major depressive disorder, recurrent, in full remission: Secondary | ICD-10-CM | POA: Diagnosis not present

## 2024-04-19 DIAGNOSIS — R030 Elevated blood-pressure reading, without diagnosis of hypertension: Secondary | ICD-10-CM | POA: Diagnosis not present

## 2024-04-19 DIAGNOSIS — E1142 Type 2 diabetes mellitus with diabetic polyneuropathy: Secondary | ICD-10-CM | POA: Diagnosis not present

## 2024-06-09 ENCOUNTER — Encounter: Payer: Self-pay | Admitting: Advanced Practice Midwife

## 2024-07-12 DIAGNOSIS — L308 Other specified dermatitis: Secondary | ICD-10-CM | POA: Diagnosis not present

## 2024-07-12 DIAGNOSIS — L309 Dermatitis, unspecified: Secondary | ICD-10-CM | POA: Diagnosis not present

## 2024-07-27 DIAGNOSIS — H81399 Other peripheral vertigo, unspecified ear: Secondary | ICD-10-CM | POA: Diagnosis not present

## 2024-07-27 DIAGNOSIS — L989 Disorder of the skin and subcutaneous tissue, unspecified: Secondary | ICD-10-CM | POA: Diagnosis not present

## 2024-09-26 ENCOUNTER — Ambulatory Visit: Admitting: Dermatology

## 2024-09-28 ENCOUNTER — Ambulatory Visit: Attending: Internal Medicine | Admitting: Physical Therapy

## 2024-09-28 DIAGNOSIS — R42 Dizziness and giddiness: Secondary | ICD-10-CM | POA: Insufficient documentation

## 2024-09-28 DIAGNOSIS — H8111 Benign paroxysmal vertigo, right ear: Secondary | ICD-10-CM | POA: Diagnosis not present

## 2024-09-28 NOTE — Patient Instructions (Signed)
 How to Perform the Epley Maneuver -- You Tube   The Epley maneuver is an exercise that relieves symptoms of vertigo. Vertigo is the feeling that you or your surroundings are moving when they are not. When you feel vertigo, you may feel like the room is spinning and may have trouble walking. The Epley maneuver is used for a type of vertigo caused by a calcium deposit in a part of the inner ear. The maneuver involves changing head positions to help the deposit move out of the area. You can do this maneuver at home whenever you have symptoms of vertigo. You can repeat it in 24 hours if your vertigo has not gone away. Even though the Epley maneuver may relieve your vertigo for a few weeks, it is possible that your symptoms will return. This maneuver relieves vertigo, but it does not relieve dizziness. What are the risks? If it is done correctly, the Epley maneuver is considered safe. Sometimes it can lead to dizziness or nausea that goes away after a short time. If you develop other symptoms--such as changes in vision, weakness, or numbness--stop doing the maneuver and call your health care provider. Supplies needed: A bed or table. A pillow. How to do the Epley maneuver     Sit on the edge of a bed or table with your back straight and your legs extended or hanging over the edge of the bed or table. Turn your head halfway toward the affected ear or side as told by your health care provider. Lie backward quickly with your head turned until you are lying flat on your back. Your head should dangle (head-hanging position). You may want to position a pillow under your shoulders. Hold this position for at least 30 seconds. If you feel dizzy or have symptoms of vertigo, continue to hold the position until the symptoms stop. Turn your head to the opposite direction until your unaffected ear is facing down. Your head should continue to dangle. Hold this position for at least 30 seconds. If you feel dizzy or  have symptoms of vertigo, continue to hold the position until the symptoms stop. Turn your whole body to the same side as your head so that you are positioned on your side. Your head will now be nearly facedown and no longer needs to dangle. Hold for at least 30 seconds. If you feel dizzy or have symptoms of vertigo, continue to hold the position until the symptoms stop. Sit back up. You can repeat the maneuver in 24 hours if your vertigo does not go away. Follow these instructions at home: For 24 hours after doing the Epley maneuver: Keep your head in an upright position. When lying down to sleep or rest, keep your head raised (elevated) with two or more pillows. Avoid excessive neck movements. Activity Do not drive or use machinery if you feel dizzy. After doing the Epley maneuver, return to your normal activities as told by your health care provider. Ask your health care provider what activities are safe for you. General instructions Drink enough fluid to keep your urine pale yellow. Do not drink alcohol. Take over-the-counter and prescription medicines only as told by your health care provider. Keep all follow-up visits. This is important. Preventing vertigo symptoms Ask your health care provider if there is anything you should do at home to prevent vertigo. He or she may recommend that you: Keep your head elevated with two or more pillows while you sleep. Do not sleep on the side  of your affected ear. Get up slowly from bed. Avoid sudden movements during the day. Avoid extreme head positions or movement, such as looking up or bending over. Contact a health care provider if: Your vertigo gets worse. You have other symptoms, including: Nausea. Vomiting. Headache. Get help right away if you: Have vision changes. Have a headache or neck pain that is severe or getting worse. Cannot stop vomiting. Have new numbness or weakness in any part of your body. These symptoms may represent a  serious problem that is an emergency. Do not wait to see if the symptoms will go away. Get medical help right away. Call your local emergency services (911 in the [place] Do not drive yourself to the hospital. Summary Vertigo is the feeling that you or your surroundings are moving when they are not. The Epley maneuver is an exercise that relieves symptoms of vertigo. If the Epley maneuver is done correctly, it is considered safe. This information is not intended to replace advice given to you by your health care provider. Make sure you discuss any questions you have with your health care provider. Document Revised: 08/06/2023 Document Reviewed: 08/06/2023 Elsevier Patient Education  2024 [employer]     Self Treatment for Right Posterior / Anterior Canalithiasis    Sitting on bed: 1. Turn head 45 right. (a) Lie back slowly, shoulders on pillow, head on bed. (b) Hold __20__ seconds. 2. Keeping head on bed, turn head 90 left. Hold __20__ seconds. 3. Roll to left, head on 45 angle down toward bed. Hold _20___ seconds. 4. Sit up on left side of bed. Repeat _3___ times per session. Do _2___ sessions per day.  Copyright  VHI. All rights reserved.

## 2024-09-28 NOTE — Therapy (Signed)
 OUTPATIENT PHYSICAL THERAPY VESTIBULAR EVALUATION     Patient Name: Paula Dunn MRN: [id number] DOB:07/10/1951, 73 y.o., female Today's Date: 10/01/2024  END OF SESSION:  PT End of Session - 10/01/24 1455     Visit Number 1    Number of Visits 4    Date for Recertification  10/27/24    Authorization Type [employer]    Authorization Time Period 09-28-24 - 11-22-24    PT Start Time 1535    PT Stop Time 1620    PT Time Calculation (min) 45 min    Activity Tolerance Patient tolerated treatment well    Behavior During Therapy [facility] for tasks assessed/performed          Past Medical History:  Diagnosis Date   Diabetes mellitus    Diverticulosis 12/2003   Headache(784.0) 01/02/2014   Heel spur    History of hematuria    History of UTI    Hyperlipidemia    Hypertension    Numbness of feet 10/15/2014   Obesity    Osteopenia    Past Surgical History:  Procedure Laterality Date   COLONOSCOPY  12/2003   ESOPHAGOGASTRODUODENOSCOPY  12/2003   EXPLORATORY LAPAROTOMY     DR. DAVIS    BENIGN   HAMMERTOE     THYROID  NEEDLE BIOPSY     VARICOSE VEIN SURGERY  06/2009   Patient Active Problem List   Diagnosis Date Noted   Essential hypertension 10/07/2021   Hyperlipidemia 10/07/2021   Numbness of feet 10/15/2014   Generalized anxiety disorder 04/10/2014   Headache 01/02/2014    PCP: Dwight Trula SQUIBB, MD REFERRING PROVIDER: Dwight Trula SQUIBB, MD  REFERRING DIAG:  Diagnosis  R42 (ICD-10-CM) - Dizziness and giddiness    THERAPY DIAG:  BPPV (benign paroxysmal positional vertigo), right  Dizziness and giddiness  ONSET DATE: July 2025  Rationale for Evaluation and Treatment: Rehabilitation  SUBJECTIVE:   SUBJECTIVE STATEMENT: Pt reports dizziness subsided within last 2-3 weeks; no dizziness reported at this time.  Thinks she had Rt BPPV - started in July of this year  Pt accompanied by: self  PERTINENT HISTORY: DM, osteopenia, h/o HA  PAIN:  Are you having pain?  No  PRECAUTIONS: None  RED FLAGS: None   WEIGHT BEARING RESTRICTIONS: No  FALLS: Has patient fallen in last 6 months? No  LIVING ENVIRONMENT: Lives with: lives alone Lives in: House/apartment Stairs: Yes: Internal: 6 steps; on right going up Has following equipment at home: None  PLOF: Independent  PATIENT GOALS: resolve the vertigo; improve balance  OBJECTIVE:  Note: Objective measures were completed at Evaluation unless otherwise noted. Gait pattern: WFL Distance walked: 11' Assistive device utilized: None Level of assistance: Complete Independence Comments: no unsteadiness noted   VESTIBULAR ASSESSMENT:  GENERAL OBSERVATION: pt is a 73 yr old lady with recent occurrence of vertiginous episode which she states resolved approx. 2-3 weeks ago; pt reports she feels that her balance has been affected by the dizziness, even though it has now resolved    SYMPTOM BEHAVIOR:  Subjective history: pt reported she had onset of dizziness   Non-Vestibular symptoms: headaches  Type of dizziness: Spinning/Vertigo  Frequency: varies  Duration:  seconds to minutes  Aggravating factors: Induced by position change: lying supine and rolling to the right  Relieving factors: head stationary and closing eyes  Progression of symptoms: better   POSITIONAL TESTING: Right Dix-Hallpike: no nystagmus Left Dix-Hallpike: no nystagmus Right Sidelying: no nystagmus Left Sidelying: no nystagmus  MOTION SENSITIVITY:  Motion Sensitivity Quotient Intensity: 0 = none, 1 = Lightheaded, 2 = Mild, 3 = Moderate, 4 = Severe, 5 = Vomiting  Intensity  1. Sitting to supine   2. Supine to L side   3. Supine to R side   4. Supine to sitting   5. L Hallpike-Dix   6. Up from L    7. R Hallpike-Dix   8. Up from R    9. Sitting, head tipped to L knee   10. Head up from L knee   11. Sitting, head tipped to R knee   12. Head up from R knee   13. Sitting head turns x5   14.Sitting head nods x5   15. In  stance, 180 turn to L    16. In stance, 180 turn to R                                                                                                                               TREATMENT DATE: 09-28-24  Self Care: Educated pt in etiology of BPPV - article from [facility] given to patient; instructed pt to stay well-hydrated; discussed Epley maneuver and Brandt-Daroff exercises as the 2 treatment options for self treatment if needed, should BPPV re-occur  HEP:  Access Code: [email or web address] URL: [email or web address] Date: 10/01/2024 Prepared by: Rock Kussmaul  Exercises - Single Leg Stance with Support  - 2-3 x daily - 7 x weekly - 1 sets - 1-2 reps - 10 secs hold - HEEL TO TOE   - 1-2 x daily - 7 x weekly - 1 sets - 2 reps - 30 sec hold - Feet together - Eyes open and eyes closed  - 1 x daily - 7 x weekly - 3 sets - 10 reps   PATIENT EDUCATION: Education details: HEP - Medbridge - see above Person educated: Patient Education method: Explanation, Demonstration, and Verbal cues Education comprehension: verbalized understanding and returned demonstration  HOME EXERCISE PROGRAM:  see above   GOALS: Goals reviewed with patient? Yes  SHORT TERM GOALS: same as LTG's as ELOS = 4 weeks   LONG TERM GOALS: Target date: 10-27-24  Pt will be independent in HEP for balance/vestibular exercises. Baseline:  Goal status: INITIAL  2.  Pt will report no dizziness with any bed mobility. Baseline:  Goal status: INITIAL  3.  Pt will report at least 25% improvement in balance. Baseline:  Goal status: INITIAL  ASSESSMENT:  CLINICAL IMPRESSION: Patient is a 73 y.o. lady who was seen today for physical therapy evaluation and treatment for dizziness consistent with BPPV which has resolved as of current time.  All positional testing was negative with no nystagmus and no c/o vertigo in any test position.  Pt did have mild postural sway with standing on compliant surface with EC, indicative  of decreased vestibular input in maintaining balance.  Pt was instructed in HEP for balance/vestibular exercises.  Pt will benefit from PT to  address balance impairments and to assess/treat BPPV should she have a recurrent episode.    OBJECTIVE IMPAIRMENTS: decreased balance and dizziness.   ACTIVITY LIMITATIONS: N/A  PARTICIPATION LIMITATIONS: N/A  PERSONAL FACTORS: 1 comorbidity: h/o BPPV are also affecting patient's functional outcome.   REHAB POTENTIAL: Excellent  CLINICAL DECISION MAKING: Stable/uncomplicated  EVALUATION COMPLEXITY: Low   PLAN:  PT FREQUENCY: 1x/week  PT DURATION: 4 weeks (anticipate need for 2 visits only)  PLANNED INTERVENTIONS: 97110-Therapeutic exercises, 97530- Therapeutic activity, [id number]- Neuromuscular re-education, [phone]- Self Care, [id number]- Gait training, [phone]- Canalith repositioning, and Patient/Family education  PLAN FOR NEXT SESSION: check HEP and Rt BPPV - D/C if no problems reported   Amaka Gluth Suzanne, PT 10/01/2024, 3:00 PM

## 2024-10-01 ENCOUNTER — Encounter: Payer: Self-pay | Admitting: Physical Therapy

## 2024-10-17 ENCOUNTER — Encounter: Payer: Self-pay | Admitting: Dermatology

## 2024-10-17 ENCOUNTER — Ambulatory Visit: Admitting: Dermatology

## 2024-10-17 DIAGNOSIS — L209 Atopic dermatitis, unspecified: Secondary | ICD-10-CM | POA: Diagnosis not present

## 2024-10-17 DIAGNOSIS — L821 Other seborrheic keratosis: Secondary | ICD-10-CM | POA: Diagnosis not present

## 2024-10-17 DIAGNOSIS — L219 Seborrheic dermatitis, unspecified: Secondary | ICD-10-CM | POA: Diagnosis not present

## 2024-10-17 MED ORDER — KETOCONAZOLE 2 % EX CREA: TOPICAL_CREAM | CUTANEOUS | 5 refills | Status: AC

## 2024-10-17 MED ORDER — CLOBETASOL PROPIONATE 0.05 % EX CREA: TOPICAL_CREAM | CUTANEOUS | 0 refills | Status: AC

## 2024-10-17 MED ORDER — KETOCONAZOLE 2 % EX SHAM
MEDICATED_SHAMPOO | CUTANEOUS | 5 refills | Status: AC
Start: 2024-10-17 — End: ?

## 2024-10-17 NOTE — Progress Notes (Signed)
 New Patient Visit   Subjective  Paula Dunn is a 73 y.o. female who presents for the following: Patient states her scalp has been constantly itching as well as the inside of her ears and cheeks. She went to her PCP for a cyst on her upper left thigh and was advised Vaseline for after treatment and states would like a recheck. Patient has mole upper right side of hip that has increased in size and would like to get checked. Patient uses Bat[place] oil in her scalp.   The following portions of the chart were reviewed this encounter and updated as appropriate: medications, allergies, medical history  Review of Systems:  No other skin or systemic complaints except as noted in HPI or Assessment and Plan.  Objective  Well appearing patient in no apparent distress; mood and affect are within normal limits.  A focused examination was performed of the following areas: Scalp, back  Relevant exam findings are noted in the Assessment and Plan.    Assessment & Plan   SEBORRHEIC DERMATITIS Exam: hyperpigmented scaly patches on periauricular skin bilaterally. Minimal scale on scalp but patient just washed hair  Chronic and persistent condition with duration or expected duration over one year. Condition is bothersome/symptomatic for patient. Currently flared.   Seborrheic Dermatitis is a chronic persistent rash characterized by pinkness and scaling most commonly of the mid face but also can occur on the scalp (dandruff), ears; mid chest, mid back and groin.  It tends to be exacerbated by stress and cooler weather.  People who have neurologic disease may experience new onset or exacerbation of existing seborrheic dermatitis.  The condition is not curable but treatable and can be controlled.  Treatment Plan: Start ketoconazole  2% shampoo apply three times per week, massage into scalp and leave in for 10 minutes before rinsing out. This can be followed by conditioner or shampoo and conditioner of your  choice.   Start ketoconazole  2% cream once to twice a day as needed.     ATOPIC DERMATITIS Exam: lichenified hyper and hypopigmented scaly plaque on right posterior thigh <1% BSA  Chronic and persistent condition with duration or expected duration over one year. Condition is bothersome/symptomatic for patient. Currently flared.   Atopic dermatitis (eczema) is a chronic, relapsing, pruritic condition that can significantly affect quality of life. It is often associated with allergic rhinitis and/or asthma and can require treatment with topical medications, phototherapy, or in severe cases biologic injectable medication (Dupixent; Adbry) or Oral JAK inhibitors.  Treatment Plan: Start clobetasol  0.05% cream twice a day as needed to affected areas right posterior thighs for up to 2 weeks. Avoid applying to face, groin, and axilla. Use as directed. Long-term use can cause thinning of the skin.  Topical steroids (such as triamcinolone, fluocinolone, fluocinonide, mometasone, clobetasol , halobetasol, betamethasone , hydrocortisone) can cause thinning and lightening of the skin if they are used for too long in the same area. Your physician has selected the right strength medicine for your problem and area affected on the body. Please use your medication only as directed by your physician to prevent side effects.    Recommend gentle skin care.   SEBORRHEIC KERATOSIS - Stuck-on, waxy, brown papule R hip  - Benign-appearing - Discussed benign etiology and prognosis. - Observe - Call for any changes   SEBORRHEIC DERMATITIS   ATOPIC DERMATITIS, UNSPECIFIED TYPE   SEBORRHEIC KERATOSES    Return if symptoms worsen or fail to improve.  I, Almetta Nora, RMA, am acting  as scribe for Boneta Sharps, MD .  Documentation: I have reviewed the above documentation for accuracy and completeness, and I agree with the above.  Boneta Sharps, MD

## 2024-10-17 NOTE — Patient Instructions (Addendum)
 Start clobetasol  0.05% cream twice a day as needed to affected areas right posterior thighs for up to 2 weeks. Avoid applying to face, groin, and axilla. Use as directed. Long-term use can cause thinning of the skin.  Start ketoconazole  shampoo apply three times per week, massage into scalp and leave in for 10 minutes before rinsing out. This can be followed by conditioner or shampoo and conditioner of your choice.   Start ketoconazole  cream once to twice a day as needed.       Seborrheic Keratosis  What causes seborrheic keratoses? Seborrheic keratoses are harmless, common skin growths that first appear during adult life.  As time goes by, more growths appear.  Some people may develop a large number of them.  Seborrheic keratoses appear on both covered and uncovered body parts.  They are not caused by sunlight.  The tendency to develop seborrheic keratoses can be inherited.  They vary in color from skin-colored to gray, brown, or even black.  They can be either smooth or have a rough, warty surface.   Seborrheic keratoses are superficial and look as if they were stuck on the skin.  Under the microscope this type of keratosis looks like layers upon layers of skin.  That is why at times the top layer may seem to fall off, but the rest of the growth remains and re-grows.    Treatment Seborrheic keratoses do not need to be treated, but can easily be removed in the office.  Seborrheic keratoses often cause symptoms when they rub on clothing or jewelry.  Lesions can be in the way of shaving.  If they become inflamed, they can cause itching, soreness, or burning.  Removal of a seborrheic keratosis can be accomplished by freezing, burning, or surgery. If any spot bleeds, scabs, or grows rapidly, please return to have it checked, as these can be an indication of a skin cancer.    Due to recent changes in healthcare laws, you may see results of your pathology and/or laboratory studies on MyChart before  the doctors have had a chance to review them. We understand that in some cases there may be results that are confusing or concerning to you. Please understand that not all results are received at the same time and often the doctors may need to interpret multiple results in order to provide you with the best plan of care or course of treatment. Therefore, we ask that you please give us  2 business days to thoroughly review all your results before contacting the office for clarification. Should we see a critical lab result, you will be contacted sooner.   If You Need Anything After Your Visit  If you have any questions or concerns for your doctor, please call our main line at [phone] and press option 4 to reach your doctor's medical assistant. If no one answers, please leave a voicemail as directed and we will return your call as soon as possible. Messages left after 4 pm will be answered the following business day.   You may also send us  a message via MyChart. We typically respond to MyChart messages within 1-2 business days.  For prescription refills, please ask your pharmacy to contact our office. Our fax number is [phone].  If you have an urgent issue when the clinic is closed that cannot wait until the next business day, you can page your doctor at the number below.    Please note that while we do our best to be available for  urgent issues outside of office hours, we are not available 24/7.   If you have an urgent issue and are unable to reach us , you may choose to seek medical care at your doctor's office, retail clinic, urgent care center, or emergency room.  If you have a medical emergency, please immediately call 911 or go to the emergency department.  Pager Numbers  - Dr. Hester: [phone]  - Dr. Jackquline: [phone]  - Dr. Claudene: [phone]   - Dr. Raymund: [phone]  In the event of inclement weather, please call our main line at [phone] for an update on  the status of any delays or closures.  Dermatology Medication Tips: Please keep the boxes that topical medications come in in order to help keep track of the instructions about where and how to use these. Pharmacies typically print the medication instructions only on the boxes and not directly on the medication tubes.   If your medication is too expensive, please contact our office at [phone] option 4 or send us  a message through MyChart.   We are unable to tell what your co-pay for medications will be in advance as this is different depending on your insurance coverage. However, we may be able to find a substitute medication at lower cost or fill out paperwork to get insurance to cover a needed medication.   If a prior authorization is required to get your medication covered by your insurance company, please allow us  1-2 business days to complete this process.  Drug prices often vary depending on where the prescription is filled and some pharmacies may offer cheaper prices.  The website www.goodrx.com contains coupons for medications through different pharmacies. The prices here do not account for what the cost may be with help from insurance (it may be cheaper with your insurance), but the website can give you the price if you did not use any insurance.  - You can print the associated coupon and take it with your prescription to the pharmacy.  - You may also stop by our office during regular business hours and pick up a GoodRx coupon card.  - If you need your prescription sent electronically to a different pharmacy, notify our office through [facility] or by phone at [phone] option 4.     Si Usted Necesita Algo Despus de Su Visita  Tambin puede enviarnos un mensaje a travs de Clinical Cytogeneticist. Por lo general respondemos a los mensajes de MyChart en el transcurso de 1 a 2 das hbiles.  Para renovar recetas, por favor pida a su farmacia que se ponga en contacto con nuestra  oficina. Randi lakes de fax es [place] [phone].  Si tiene un asunto urgente cuando la clnica est cerrada y que no puede esperar hasta el siguiente da hbil, puede llamar/localizar a su doctor(a) al nmero que aparece a continuacin.   Por favor, tenga en cuenta que aunque hacemos todo lo posible para estar disponibles para asuntos urgentes fuera del horario de [place], no estamos disponibles las 24 horas del da, los 7 [address] de la [place].   Si tiene un problema urgente y no puede comunicarse con nosotros, puede optar por buscar atencin mdica  en el consultorio de su doctor(a), en una clnica privada, en un centro de atencin urgente o en una sala de emergencias.  Si tiene engineer, drilling, por favor llame inmediatamente al 911 o vaya a la sala de emergencias.  Nmeros de bper  - Dr. Hester: [phone]  - Dra. Jackquline: [phone]  -  Dr. Claudene: [phone]  - Dra. Kitts: [phone]  En caso de inclemencias del [place], por favor llame a nuestra lnea principal al [phone] para una actualizacin sobre el estado de cualquier retraso o cierre.  Consejos para la medicacin en dermatologa: Por favor, guarde las cajas en las que vienen los medicamentos de uso tpico para ayudarle a seguir las instrucciones sobre dnde y cmo usarlos. Las farmacias generalmente imprimen las instrucciones del medicamento slo en las cajas y no directamente en los tubos del [place].   Si su medicamento es muy caro, por favor, pngase en contacto con landry rieger llamando al [phone] y presione la opcin 4 o envenos un mensaje a travs de Clinical Cytogeneticist.   No podemos decirle cul ser su copago por los medicamentos por adelantado ya que esto es diferente dependiendo de la cobertura de su seguro. Sin embargo, es posible que podamos encontrar un medicamento sustituto a audiological scientist un formulario para que el seguro cubra el medicamento que se considera necesario.   Si se requiere  una autorizacin previa para que su compaa de seguros cubra su medicamento, por favor permtanos de 1 a 2 das hbiles para completar este proceso.  Los precios de los medicamentos varan con frecuencia dependiendo del environmental consultant de dnde se surte la receta y alguna farmacias pueden ofrecer precios ms baratos.  El sitio web www.goodrx.com tiene cupones para medicamentos de health and safety inspector. Los precios aqu no tienen en cuenta lo que podra costar con la ayuda del seguro (puede ser ms barato con su seguro), pero el sitio web puede darle el precio si no utiliz tourist information centre manager.  - Puede imprimir el cupn correspondiente y llevarlo con su receta a la farmacia.  - Tambin puede pasar por nuestra oficina durante el horario de atencin regular y education officer, museum una tarjeta de cupones de GoodRx.  - Si necesita que su receta se enve electrnicamente a una farmacia diferente, informe a nuestra oficina a travs de MyChart de [place] o por telfono llamando al [phone] y presione la opcin 4. Due to recent changes in healthcare laws, you may see results of your pathology and/or laboratory studies on MyChart before the doctors have had a chance to review them. We understand that in some cases there may be results that are confusing or concerning to you. Please understand that not all results are received at the same time and often the doctors may need to interpret multiple results in order to provide you with the best plan of care or course of treatment. Therefore, we ask that you please give us  2 business days to thoroughly review all your results before contacting the office for clarification. Should we see a critical lab result, you will be contacted sooner.   If You Need Anything After Your Visit  If you have any questions or concerns for your doctor, please call our main line at [phone] and press option 4 to reach your doctor's medical assistant. If no one answers, please leave a voicemail as directed  and we will return your call as soon as possible. Messages left after 4 pm will be answered the following business day.   You may also send us  a message via MyChart. We typically respond to MyChart messages within 1-2 business days.  For prescription refills, please ask your pharmacy to contact our office. Our fax number is [phone].  If you have an urgent issue when the clinic is closed that cannot wait until the next business day, you can page  your doctor at the number below.    Please note that while we do our best to be available for urgent issues outside of office hours, we are not available 24/7.   If you have an urgent issue and are unable to reach us , you may choose to seek medical care at your doctor's office, retail clinic, urgent care center, or emergency room.  If you have a medical emergency, please immediately call 911 or go to the emergency department.  Pager Numbers  - Dr. Hester: [phone]  - Dr. Jackquline: [phone]  - Dr. Claudene: [phone]   - Dr. Raymund: [phone]  In the event of inclement weather, please call our main line at [phone] for an update on the status of any delays or closures.  Dermatology Medication Tips: Please keep the boxes that topical medications come in in order to help keep track of the instructions about where and how to use these. Pharmacies typically print the medication instructions only on the boxes and not directly on the medication tubes.   If your medication is too expensive, please contact our office at [phone] option 4 or send us  a message through MyChart.   We are unable to tell what your co-pay for medications will be in advance as this is different depending on your insurance coverage. However, we may be able to find a substitute medication at lower cost or fill out paperwork to get insurance to cover a needed medication.   If a prior authorization is required to get your medication covered by your  insurance company, please allow us  1-2 business days to complete this process.  Drug prices often vary depending on where the prescription is filled and some pharmacies may offer cheaper prices.  The website www.goodrx.com contains coupons for medications through different pharmacies. The prices here do not account for what the cost may be with help from insurance (it may be cheaper with your insurance), but the website can give you the price if you did not use any insurance.  - You can print the associated coupon and take it with your prescription to the pharmacy.  - You may also stop by our office during regular business hours and pick up a GoodRx coupon card.  - If you need your prescription sent electronically to a different pharmacy, notify our office through [facility] or by phone at [phone] option 4.     Si Usted Necesita Algo Despus de Su Visita  Tambin puede enviarnos un mensaje a travs de Clinical Cytogeneticist. Por lo general respondemos a los mensajes de MyChart en el transcurso de 1 a 2 das hbiles.  Para renovar recetas, por favor pida a su farmacia que se ponga en contacto con nuestra oficina. Randi lakes de fax es [place] [phone].  Si tiene un asunto urgente cuando la clnica est cerrada y que no puede esperar hasta el siguiente da hbil, puede llamar/localizar a su doctor(a) al nmero que aparece a continuacin.   Por favor, tenga en cuenta que aunque hacemos todo lo posible para estar disponibles para asuntos urgentes fuera del horario de [place], no estamos disponibles las 24 horas del da, los 7 [address] de la [place].   Si tiene un problema urgente y no puede comunicarse con nosotros, puede optar por buscar atencin mdica  en el consultorio de su doctor(a), en una clnica privada, en un centro de atencin urgente o en una sala de emergencias.  Si tiene una emergencia mdica, por favor llame inmediatamente al 911 o  vaya a la sala de emergencias.  Nmeros de  bper  - Dr. Hester: [phone]  - Dra. Jackquline: [phone]  - Dr. Claudene: [phone]  - Dra. Kitts: [phone]  En caso de inclemencias del [place], por favor llame a nuestra lnea principal al [phone] para una actualizacin sobre el estado de cualquier retraso o cierre.  Consejos para la medicacin en dermatologa: Por favor, guarde las cajas en las que vienen los medicamentos de uso tpico para ayudarle a seguir las instrucciones sobre dnde y cmo usarlos. Las farmacias generalmente imprimen las instrucciones del medicamento slo en las cajas y no directamente en los tubos del [place].   Si su medicamento es muy caro, por favor, pngase en contacto con landry rieger llamando al [phone] y presione la opcin 4 o envenos un mensaje a travs de Clinical Cytogeneticist.   No podemos decirle cul ser su copago por los medicamentos por adelantado ya que esto es diferente dependiendo de la cobertura de su seguro. Sin embargo, es posible que podamos encontrar un medicamento sustituto a audiological scientist un formulario para que el seguro cubra el medicamento que se considera necesario.   Si se requiere una autorizacin previa para que su compaa de seguros cubra su medicamento, por favor permtanos de 1 a 2 das hbiles para completar este proceso.  Los precios de los medicamentos varan con frecuencia dependiendo del environmental consultant de dnde se surte la receta y alguna farmacias pueden ofrecer precios ms baratos.  El sitio web www.goodrx.com tiene cupones para medicamentos de health and safety inspector. Los precios aqu no tienen en cuenta lo que podra costar con la ayuda del seguro (puede ser ms barato con su seguro), pero el sitio web puede darle el precio si no utiliz tourist information centre manager.  - Puede imprimir el cupn correspondiente y llevarlo con su receta a la farmacia.  - Tambin puede pasar por nuestra oficina durante el horario de atencin regular y education officer, museum una tarjeta de cupones de GoodRx.  -  Si necesita que su receta se enve electrnicamente a una farmacia diferente, informe a nuestra oficina a travs de MyChart de [place] o por telfono llamando al [phone] y presione la opcin 4.

## 2024-10-24 ENCOUNTER — Ambulatory Visit: Payer: Self-pay | Admitting: Physical Therapy

## 2024-10-24 DIAGNOSIS — H8111 Benign paroxysmal vertigo, right ear: Secondary | ICD-10-CM | POA: Insufficient documentation

## 2024-10-24 DIAGNOSIS — R2681 Unsteadiness on feet: Secondary | ICD-10-CM | POA: Diagnosis present

## 2024-10-24 NOTE — Therapy (Unsigned)
 OUTPATIENT PHYSICAL THERAPY VESTIBULAR TREATMENT NOTE     Patient Name: Paula Dunn MRN: [id number] DOB:05-20-1951, 73 y.o., female Today's Date: 10/25/2024  END OF SESSION:  PT End of Session - 10/25/24 1527     Visit Number 2    Number of Visits 4    Date for Recertification  10/27/24    Authorization Type [employer]    Authorization Time Period 09-28-24 - 11-22-24; (09-28-24 - 12-27-24)    Authorization - Visit Number 2    Authorization - Number of Visits 4    PT Start Time 1102    PT Stop Time 1145    PT Time Calculation (min) 43 min    Activity Tolerance Patient tolerated treatment well    Behavior During Therapy [facility] for tasks assessed/performed           Past Medical History:  Diagnosis Date   Diabetes mellitus    Diverticulosis 12/2003   Headache(784.0) 01/02/2014   Heel spur    History of hematuria    History of UTI    Hyperlipidemia    Hypertension    Numbness of feet 10/15/2014   Obesity    Osteopenia    Past Surgical History:  Procedure Laterality Date   COLONOSCOPY  12/2003   ESOPHAGOGASTRODUODENOSCOPY  12/2003   EXPLORATORY LAPAROTOMY     DR. DAVIS    BENIGN   HAMMERTOE     THYROID  NEEDLE BIOPSY     VARICOSE VEIN SURGERY  06/2009   Patient Active Problem List   Diagnosis Date Noted   Essential hypertension 10/07/2021   Hyperlipidemia 10/07/2021   Numbness of feet 10/15/2014   Generalized anxiety disorder 04/10/2014   Headache 01/02/2014    PCP: Dwight Trula SQUIBB, MD REFERRING PROVIDER: Dwight Trula SQUIBB, MD  REFERRING DIAG:  Diagnosis  R42 (ICD-10-CM) - Dizziness and giddiness    THERAPY DIAG:  BPPV (benign paroxysmal positional vertigo), right  Unsteadiness on feet  ONSET DATE: July 2025  Rationale for Evaluation and Treatment: Rehabilitation  SUBJECTIVE:   SUBJECTIVE STATEMENT: Pt reports she has not had any vertigo since treatment 3 weeks ago at initial eval; reports her balance is not quite as good as it was prior to the  BPPV episode.  Wants to learn some balance exercises to work on at home. Pt accompanied by: self  PERTINENT HISTORY: DM, osteopenia, h/o HA  PAIN:  Are you having pain? No  PRECAUTIONS: None  RED FLAGS: None   WEIGHT BEARING RESTRICTIONS: No  FALLS: Has patient fallen in last 6 months? No  LIVING ENVIRONMENT: Lives with: lives alone Lives in: House/apartment Stairs: Yes: Internal: 6 steps; on right going up Has following equipment at home: None  PLOF: Independent  PATIENT GOALS: resolve the vertigo; improve balance  OBJECTIVE:  Note: Objective measures were completed at Evaluation unless otherwise noted. Gait pattern: WFL Distance walked: 8' Assistive device utilized: None Level of assistance: Complete Independence Comments: no unsteadiness noted   VESTIBULAR ASSESSMENT:  GENERAL OBSERVATION: pt is a 73 yr old lady with recent occurrence of vertiginous episode which she states resolved approx. 2-3 weeks ago; pt reports she feels that her balance has been affected by the dizziness, even though it has now resolved    SYMPTOM BEHAVIOR:  Subjective history: pt reported she had onset of dizziness   Non-Vestibular symptoms: headaches  Type of dizziness: Spinning/Vertigo  Frequency: varies  Duration:  seconds to minutes  Aggravating factors: Induced by position change: lying supine and rolling to  the right  Relieving factors: head stationary and closing eyes  Progression of symptoms: better   POSITIONAL TESTING: Right Dix-Hallpike: no nystagmus Left Dix-Hallpike: no nystagmus Right Sidelying: no nystagmus Left Sidelying: no nystagmus  MOTION SENSITIVITY:  Motion Sensitivity Quotient Intensity: 0 = none, 1 = Lightheaded, 2 = Mild, 3 = Moderate, 4 = Severe, 5 = Vomiting  Intensity  1. Sitting to supine   2. Supine to L side   3. Supine to R side   4. Supine to sitting   5. L Hallpike-Dix   6. Up from L    7. R Hallpike-Dix   8. Up from R    9. Sitting, head  tipped to L knee   10. Head up from L knee   11. Sitting, head tipped to R knee   12. Head up from R knee   13. Sitting head turns x5   14.Sitting head nods x5   15. In stance, 180 turn to L    16. In stance, 180 turn to R                                                                                                                               TREATMENT DATE: 10-24-24  NeuroRe-ed:  Rt sidelying test (-) with no nystagmus and no c/o vertigo in test position  TherAct:  Sit to stand with pivot turn for habituation with turning as pt reports unsteadiness with turning  Pt performed the following exercises for HEP: Access Code: [email or web address] URL: [email or web address] Date: 10/25/2024 Prepared by: Rock Kussmaul  Exercises - Standing Quarter Turn with Counter Support  - 1 x daily - 7 x weekly - 1 sets - 10 reps - Standing Hip Flexion with Counter Support  - 1 x daily - 7 x weekly - 3 sets - 10 reps - Standing March with Unilateral Counter Support  - 1 x daily - 7 x weekly - 3 sets - 10 reps - Standing Hip Abduction with Counter Support  - 1 x daily - 7 x weekly - 3 sets - 10 reps - Standing Hip Extension with Unilateral Counter Support  - 1 x daily - 7 x weekly - 3 sets - 10 reps - Walking with Head Rotation  - 1 x daily - 7 x weekly - 1 sets - 10 reps - Standing on foam pad - Eyes open and then with Eyes closed   - 1 x daily - 7 x weekly - 1 sets - 1 reps  HEP:  Access Code: [email or web address] URL: [email or web address] Date: 10/01/2024 Prepared by: Rock Kussmaul  Exercises - Single Leg Stance with Support  - 2-3 x daily - 7 x weekly - 1 sets - 1-2 reps - 10 secs hold - HEEL TO TOE   - 1-2 x daily - 7 x weekly - 1 sets - 2 reps - 30 sec hold - Feet together - Eyes open and eyes closed  -  1 x daily - 7 x weekly - 3 sets - 10 reps   PATIENT EDUCATION: Education details: HEP - Medbridge  [email or web address] - balance HEP issued on 10-24-24 Person educated:  Patient Education method: Explanation, Demonstration, and Verbal cues Education comprehension: verbalized understanding and returned demonstration  HOME EXERCISE PROGRAM:  see above   GOALS: Goals reviewed with patient? Yes  SHORT TERM GOALS: same as LTG's as ELOS = 4 weeks   LONG TERM GOALS: Target date: 10-27-24  Pt will be independent in HEP for balance/vestibular exercises. Baseline:  Goal status: INITIAL  2.  Pt will report no dizziness with any bed mobility. Baseline:  Goal status:  Goal met 10-24-24  3.  Pt will report at least 25% improvement in balance. Baseline:  Goal status:  Goal met 10-24-24  ASSESSMENT:  CLINICAL IMPRESSION: PT session focused on positional testing with Rt sidelying test (-) with no nystagmus and no c/o vertigo in test position, indicative of resolution of Rt BPPV.  Pt reports she has not had any vertigo since eval on 09-28-24.  Remainder of session focused on balance exercises for HEP with pt needing intermittent UE support on counter for safety.  Pt had mild postural sway with standing on compliant surface with EC, indicative of mild decreased vestibular input in maintaining balance.  Pt has met LTG's #2 & 3. LTG #1 is ongoing as balance HEP issued today.   Pt requested to return in approx. 1 month to allow her time to work on HEP and to ensure BPPV remains resolved.    OBJECTIVE IMPAIRMENTS: decreased balance and dizziness.   ACTIVITY LIMITATIONS: N/A  PARTICIPATION LIMITATIONS: N/A  PERSONAL FACTORS: 1 comorbidity: h/o BPPV are also affecting patient's functional outcome.   REHAB POTENTIAL: Excellent  CLINICAL DECISION MAKING: Stable/uncomplicated  EVALUATION COMPLEXITY: Low   PLAN:  PT FREQUENCY: 1x/week  PT DURATION: 4 weeks (anticipate need for 2 visits only)  PLANNED INTERVENTIONS: 97110-Therapeutic exercises, 97530- Therapeutic activity, [id number]- Neuromuscular re-education, 97535- Self Care, [id number]- Gait training, [phone]- Canal[place]  repositioning, and Patient/Family education  PLAN FOR NEXT SESSION: check balance HEP: Rt BPPV still resolved? - D/C if no new problems    Dvonte G[place], [place], PT 10/25/2024, 3:35 PM

## 2024-10-25 ENCOUNTER — Encounter: Payer: Self-pay | Admitting: Physical Therapy

## 2024-10-26 DIAGNOSIS — Z79899 Other long term (current) drug therapy: Secondary | ICD-10-CM | POA: Diagnosis not present

## 2024-10-26 DIAGNOSIS — Z0000 Encounter for general adult medical examination without abnormal findings: Secondary | ICD-10-CM | POA: Diagnosis not present

## 2024-10-26 DIAGNOSIS — E1142 Type 2 diabetes mellitus with diabetic polyneuropathy: Secondary | ICD-10-CM | POA: Diagnosis not present

## 2024-10-26 DIAGNOSIS — E7800 Pure hypercholesterolemia, unspecified: Secondary | ICD-10-CM | POA: Diagnosis not present

## 2024-10-26 DIAGNOSIS — Z1231 Encounter for screening mammogram for malignant neoplasm of breast: Secondary | ICD-10-CM | POA: Diagnosis not present

## 2024-10-26 DIAGNOSIS — D500 Iron deficiency anemia secondary to blood loss (chronic): Secondary | ICD-10-CM | POA: Diagnosis not present

## 2024-10-26 DIAGNOSIS — E559 Vitamin D deficiency, unspecified: Secondary | ICD-10-CM | POA: Diagnosis not present

## 2024-11-01 ENCOUNTER — Ambulatory Visit: Admitting: Podiatry

## 2024-11-08 ENCOUNTER — Ambulatory Visit: Admitting: Podiatry

## 2024-11-24 ENCOUNTER — Ambulatory Visit: Admitting: Podiatry

## 2024-11-28 ENCOUNTER — Ambulatory Visit: Admitting: Podiatry

## 2024-12-07 ENCOUNTER — Ambulatory Visit: Payer: Self-pay | Attending: Internal Medicine | Admitting: Physical Therapy
